# Patient Record
Sex: Male | Born: 1980
Health system: Southern US, Community
[De-identification: ages and names within clinical notes are randomized; demographics above are authoritative.]

## PROBLEM LIST (undated history)

## (undated) DIAGNOSIS — T7840XA Allergy, unspecified, initial encounter: Secondary | ICD-10-CM

## (undated) DIAGNOSIS — F419 Anxiety disorder, unspecified: Secondary | ICD-10-CM

## (undated) DIAGNOSIS — F1911 Other psychoactive substance abuse, in remission: Secondary | ICD-10-CM

## (undated) DIAGNOSIS — B019 Varicella without complication: Secondary | ICD-10-CM

## (undated) DIAGNOSIS — J40 Bronchitis, not specified as acute or chronic: Secondary | ICD-10-CM

## (undated) DIAGNOSIS — U071 COVID-19: Secondary | ICD-10-CM

## (undated) HISTORY — DX: COVID-19: U07.1

## (undated) HISTORY — PX: VASECTOMY: SHX75

## (undated) HISTORY — DX: Anxiety disorder, unspecified: F41.9

## (undated) HISTORY — DX: Allergy, unspecified, initial encounter: T78.40XA

## (undated) HISTORY — DX: Other psychoactive substance abuse, in remission: F19.11

## (undated) HISTORY — DX: Bronchitis, not specified as acute or chronic: J40

## (undated) HISTORY — DX: Varicella without complication: B01.9

---
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
## 2013-09-08 LAB — HIV ANTIBODY (ROUTINE TESTING W REFLEX): HIV 1&2 Ab, 4th Generation: NEGATIVE

## 2015-09-16 DIAGNOSIS — F1911 Other psychoactive substance abuse, in remission: Secondary | ICD-10-CM

## 2015-09-16 HISTORY — DX: Other psychoactive substance abuse, in remission: F19.11

## 2017-12-23 DIAGNOSIS — Z0000 Encounter for general adult medical examination without abnormal findings: Secondary | ICD-10-CM | POA: Diagnosis not present

## 2018-02-10 ENCOUNTER — Encounter: Payer: Self-pay | Admitting: Urology

## 2018-02-10 ENCOUNTER — Ambulatory Visit (INDEPENDENT_AMBULATORY_CARE_PROVIDER_SITE_OTHER): Payer: 59 | Admitting: Urology

## 2018-02-10 VITALS — BP 134/79 | HR 76 | Ht 70.0 in | Wt 155.0 lb

## 2018-02-10 DIAGNOSIS — Z3009 Encounter for other general counseling and advice on contraception: Secondary | ICD-10-CM

## 2018-02-11 ENCOUNTER — Encounter: Payer: Self-pay | Admitting: Urology

## 2018-02-11 MED ORDER — DIAZEPAM 10 MG PO TABS: ORAL_TABLET | ORAL | 0 refills | Status: DC

## 2018-02-11 NOTE — Progress Notes (Signed)
02/10/2018 7:44 AM   Ethel Rana April 27, 1981 [id number]  Referring provider: Marina Goodell, MD [address] [place], [place] [address]  Chief Complaint  Patient presents with  . VAS Consult    New Patient    HPI: 37 year old male married with children presents with his wife for vasectomy counseling.  They have discussed permanent sterilization and desire vasectomy.  He denies previous history of urologic problems and specifically denies chronic scrotal pain or history of epididymitis.   PMH: Past Medical History:  Diagnosis Date  . History of substance abuse 09/16/2015    Surgical History: Past Surgical History:  Procedure Laterality Date  . CLUB FOOT RELEASE  1980    Home Medications:  Allergies as of 02/10/2018      Reactions   Latex Rash      Medication List    as of 02/10/2018 11:59 PM   You have not been prescribed any medications.     Allergies:  Allergies  Allergen Reactions  . Latex Rash    Family History: History reviewed. No pertinent family history.  Social History:  reports that he has never smoked. He has never used smokeless tobacco. He reports that he drinks alcohol. He reports that he does not use drugs.  ROS: UROLOGY Frequent Urination?: No Hard to postpone urination?: No Burning/pain with urination?: No Get up at night to urinate?: No Leakage of urine?: No Urine stream starts and stops?: No Trouble starting stream?: No Do you have to strain to urinate?: No Blood in urine?: No Urinary tract infection?: No Sexually transmitted disease?: No Injury to kidneys or bladder?: No Painful intercourse?: No Weak stream?: No Erection problems?: No Penile pain?: No  Gastrointestinal Nausea?: No Vomiting?: No Indigestion/heartburn?: No Diarrhea?: No Constipation?: No  Constitutional Fever: No Night sweats?: No Weight loss?: No Fatigue?: No  Skin Skin rash/lesions?: No Itching?: No  Eyes Blurred vision?:  No Double vision?: No  Ears/Nose/Throat Sore throat?: No Sinus problems?: No  Hematologic/Lymphatic Swollen glands?: No Easy bruising?: No  Cardiovascular Leg swelling?: No Chest pain?: No  Respiratory Cough?: No Shortness of breath?: No  Endocrine Excessive thirst?: No  Musculoskeletal Back pain?: No Joint pain?: No  Neurological Headaches?: No Dizziness?: No  Psychologic Depression?: No Anxiety?: No  Physical Exam: BP 134/79   Pulse 76   Ht 5\' 10"  (1.778 m)   Wt 155 lb (70.3 kg)   BMI 22.24 kg/m   Constitutional:  Alert and oriented, No acute distress. HEENT: [place] AT, moist mucus membranes.  Trachea midline, no masses. Cardiovascular: No clubbing, cyanosis, or edema. Respiratory: Normal respiratory effort, no increased work of breathing. GI: Abdomen is soft, nontender, nondistended, no abdominal masses GU: No CVA tenderness.  Penis without lesions, testes descended bilaterally without masses or tenderness.  No paratesticular abnormalities.  Vasa easily palpable bilaterally. Lymph: No cervical or inguinal lymphadenopathy. Skin: No rashes, bruises or suspicious lesions. Neurologic: Grossly intact, no focal deficits, moving all 4 extremities. Psychiatric: Normal mood and affect.    Assessment & Plan:   37 year old male with undesired fertility who desires to proceed with vasectomy.  We had a long discussion about vasectomy. We specifically discussed the procedure, recovery and the risks, benefits and alternatives of vasectomy. I explained that the procedure entails removal of a segment of each vas deferens, each of which conducts sperm, and that the purpose of this procedure is to cause sterility (inability to produce children or cause pregnancy). Vasectomy is intended to be permanent and irreversible form  of contraception. Options for fertility after vasectomy include vasectomy reversal, or sperm retrieval with in vitro fertilization. These options are not always  successful, and they may be expensive. We discussed reversible forms of birth control such as condoms, IUD or diaphragms, as well as the option of freezing sperm in a sperm bank prior to the vasectomy procedure. We discussed the importance of avoiding strenuous exercise for four days after vasectomy, and the importance of refraining from any form of ejaculation for seven days after vasectomy. I explained that vasectomy does not produce immediate sterility so another form of contraceptive must be used until sterility is assured by having semen checked for sperm. Thus, a post vasectomy semen analysis is necessary to confirm sterility. Rarely, vasectomy must be repeated. We discussed the approximately 1 in 2,000 risk of pregnancy after vasectomy for men who have post-vasectomy semen analysis showing absent sperm or rare non-motile sperm. Typical side effects include a small amount of oozing blood, some discomfort and mild swelling in the area of incision.  Vasectomy does not affect sexual performance, function, please, sensation, interest, desire, satisfaction, penile erection, volume of semen or ejaculation. Other rare risks include allergy or adverse reaction to an anesthetic, testicular atrophy, hematoma, infection/abscess, prolonged tenderness of the vas deferens, pain, swelling, painful nodule or scar (called sperm granuloma) or epididymtis. We discussed chronic testicular pain syndrome. This has been reported to occur in as many as 1-2% of men and may be permanent. This can be treated with medication, small procedures or (rarely) surgery.   Riki AltesScott C Lowana Hable, MD  [facility] [address], [address] [place], [place] [address] [phone]

## 2018-03-23 ENCOUNTER — Ambulatory Visit (INDEPENDENT_AMBULATORY_CARE_PROVIDER_SITE_OTHER): Payer: 59 | Admitting: Urology

## 2018-03-23 ENCOUNTER — Encounter: Payer: Self-pay | Admitting: Urology

## 2018-03-23 VITALS — BP 107/57 | HR 69 | Ht 70.0 in | Wt 160.8 lb

## 2018-03-23 DIAGNOSIS — Z302 Encounter for sterilization: Secondary | ICD-10-CM

## 2018-03-23 MED ORDER — HYDROCODONE-ACETAMINOPHEN 5-325 MG PO TABS: 1.0000 | ORAL_TABLET | ORAL | 0 refills | Status: DC | PRN

## 2018-03-23 NOTE — Progress Notes (Signed)
Vasectomy Procedure Note  Indications: The patient is a 37 y.o. male who presents today for elective sterilization.  He has been consented for the procedure.  He is aware of the risks and benefits.  He had no additional questions.  He agrees to proceed.  He denies any other significant change since his last visit.  Pre-operative Diagnosis: Elective sterilization  Post-operative Diagnosis: Elective sterilization  Premedication: Valium 10 mg po  Surgeon: Lorin PicketScott C. Stoioff, M.D  Description: The patient was prepped and draped in the standard fashion.  The right vas deferens was identified and brought superiorly to the anterior scrotal skin.  The skin and vas was then anesthetized utilizing 6 ml 1% lidocaine.  A small stab incision was made and spread with the vas dissector.  The vas was grasped utilizing the vas clamp and elevated out of the incision.  The vas was dissected free from surrounding tissue and vessels and an ~1 cm segment was excised.  The vas lumens were cauterized utilizing electrocautery.  The distal segment was buried in the surrounding sheath with a 3-0 chromic suture.  No significant bleeding was observed.  The vas ends were then dropped back into the hemiscrotum.  The skin was closed with hemostatic pressure.  An identical procedure was performed on the contralateral side.  Clean dry gauze was applied to the incision sites.  The patient tolerated the procedure well.  Complications:None  Recommendations: 1.  No lifting greater than 10 pounds or strenuousactivity for 1 week. 2.  Scrotal support for 1 week. 3.  Shower only for 1 week; may shower in the morning 4.  May resume intercourse in one week if no significant discomfort.  Continue alternate contraception for 12 weeks.  5.  Call for significant pain, swelling, redness, drainage or fever greater than 100.5. 6.  Rx hydrocodone/APAP 5/325 1-2 every 6 hours as needed for pain. 7.  Follow-up semen analysis 12 weeks.

## 2018-05-26 DIAGNOSIS — H52221 Regular astigmatism, right eye: Secondary | ICD-10-CM | POA: Diagnosis not present

## 2018-05-26 DIAGNOSIS — H5213 Myopia, bilateral: Secondary | ICD-10-CM | POA: Diagnosis not present

## 2018-06-14 ENCOUNTER — Other Ambulatory Visit: Payer: Self-pay

## 2018-06-14 DIAGNOSIS — Z9852 Vasectomy status: Secondary | ICD-10-CM

## 2018-06-15 ENCOUNTER — Encounter: Payer: Self-pay | Admitting: Urology

## 2018-06-15 ENCOUNTER — Other Ambulatory Visit: Payer: 59

## 2018-06-17 ENCOUNTER — Other Ambulatory Visit: Payer: 59

## 2018-06-17 ENCOUNTER — Encounter: Payer: Self-pay | Admitting: Urology

## 2018-06-28 ENCOUNTER — Other Ambulatory Visit: Payer: 59

## 2018-06-28 DIAGNOSIS — Z9852 Vasectomy status: Secondary | ICD-10-CM | POA: Diagnosis not present

## 2018-06-29 LAB — POST-VAS SPERM EVALUATION,QUAL: SEMEN VOLUME: 0.9 mL

## 2018-06-30 ENCOUNTER — Telehealth: Payer: Self-pay | Admitting: Family Medicine

## 2018-06-30 NOTE — Telephone Encounter (Signed)
-----   Message from Riki AltesScott C Stoioff, MD sent at 06/30/2018  7:34 AM EST ----- Semen analysis showed no sperm present.  Okay to use vasectomy as primary contraception.

## 2018-06-30 NOTE — Telephone Encounter (Signed)
Unable to leave message, no VM set up.

## 2018-07-01 NOTE — Telephone Encounter (Signed)
Attempted to reach pt, no vm is set up, Second Attempt

## 2018-07-05 NOTE — Telephone Encounter (Signed)
Called pt no answer. 3rd attempt will mail letter. Also sent link for mychart sign up.

## 2018-07-28 DIAGNOSIS — J40 Bronchitis, not specified as acute or chronic: Secondary | ICD-10-CM | POA: Diagnosis not present

## 2018-12-26 DIAGNOSIS — Z0000 Encounter for general adult medical examination without abnormal findings: Secondary | ICD-10-CM | POA: Diagnosis not present

## 2019-08-09 DIAGNOSIS — M542 Cervicalgia: Secondary | ICD-10-CM | POA: Diagnosis not present

## 2019-08-09 DIAGNOSIS — M25611 Stiffness of right shoulder, not elsewhere classified: Secondary | ICD-10-CM | POA: Diagnosis not present

## 2019-08-09 DIAGNOSIS — M9901 Segmental and somatic dysfunction of cervical region: Secondary | ICD-10-CM | POA: Diagnosis not present

## 2019-08-09 DIAGNOSIS — M9907 Segmental and somatic dysfunction of upper extremity: Secondary | ICD-10-CM | POA: Diagnosis not present

## 2019-12-27 DIAGNOSIS — S46911A Strain of unspecified muscle, fascia and tendon at shoulder and upper arm level, right arm, initial encounter: Secondary | ICD-10-CM | POA: Diagnosis not present

## 2019-12-27 DIAGNOSIS — Z125 Encounter for screening for malignant neoplasm of prostate: Secondary | ICD-10-CM | POA: Diagnosis not present

## 2019-12-27 DIAGNOSIS — Z0000 Encounter for general adult medical examination without abnormal findings: Secondary | ICD-10-CM | POA: Diagnosis not present

## 2019-12-27 DIAGNOSIS — Z13220 Encounter for screening for lipoid disorders: Secondary | ICD-10-CM | POA: Diagnosis not present

## 2020-01-23 DIAGNOSIS — H52221 Regular astigmatism, right eye: Secondary | ICD-10-CM | POA: Diagnosis not present

## 2020-01-23 DIAGNOSIS — H5213 Myopia, bilateral: Secondary | ICD-10-CM | POA: Diagnosis not present

## 2020-12-26 ENCOUNTER — Encounter: Payer: Self-pay | Admitting: Emergency Medicine

## 2020-12-26 ENCOUNTER — Other Ambulatory Visit: Payer: Self-pay

## 2020-12-26 ENCOUNTER — Ambulatory Visit
Admission: EM | Admit: 2020-12-26 | Discharge: 2020-12-26 | Disposition: A | Discharge status: Home / Self Care | Payer: BC Managed Care – PPO | Attending: Physician Assistant | Admitting: Physician Assistant

## 2020-12-26 DIAGNOSIS — Z7952 Long term (current) use of systemic steroids: Secondary | ICD-10-CM | POA: Diagnosis not present

## 2020-12-26 DIAGNOSIS — J029 Acute pharyngitis, unspecified: Secondary | ICD-10-CM | POA: Diagnosis not present

## 2020-12-26 DIAGNOSIS — Z20822 Contact with and (suspected) exposure to covid-19: Secondary | ICD-10-CM | POA: Insufficient documentation

## 2020-12-26 DIAGNOSIS — R059 Cough, unspecified: Secondary | ICD-10-CM | POA: Diagnosis not present

## 2020-12-26 DIAGNOSIS — J209 Acute bronchitis, unspecified: Secondary | ICD-10-CM | POA: Diagnosis present

## 2020-12-26 LAB — RESP PANEL BY RT-PCR (FLU A&B, COVID) ARPGX2
Influenza A by PCR: NEGATIVE
Influenza B by PCR: NEGATIVE
SARS Coronavirus 2 by RT PCR: NEGATIVE

## 2020-12-26 LAB — GROUP A STREP BY PCR: Group A Strep by PCR: NOT DETECTED

## 2020-12-26 MED ORDER — PROMETHAZINE-DM 6.25-15 MG/5ML PO SYRP: 5.0000 mL | ORAL_SOLUTION | Freq: Four times a day (QID) | ORAL | 0 refills | Status: DC | PRN

## 2020-12-26 MED ORDER — PREDNISONE 20 MG PO TABS: 40.0000 mg | ORAL_TABLET | Freq: Every day | ORAL | 0 refills | Status: AC

## 2020-12-26 MED ORDER — AZITHROMYCIN 250 MG PO TABS: 250.0000 mg | ORAL_TABLET | Freq: Every day | ORAL | 0 refills | Status: DC

## 2020-12-26 NOTE — Discharge Instructions (Signed)

## 2020-12-26 NOTE — ED Provider Notes (Signed)
[facility]    CSN: [id number] Arrival date & time: 12/26/20  1352      History   Chief Complaint Chief Complaint  Patient presents with  . Cough  . Nasal Congestion  . Sore Throat    HPI Tim Ramirez is a 40 y.o. male presenting for 3 to 4-week history of productive cough, nasal congestion, and sore throat.  Patient says that the sore throat related to start over the past couple of days getting worse.  He denies any fevers or fatigue.  No sinus pain or chest tightness but says he occasionally is short of breath when he is working.  States he has to work around a lot of chemicals and that seems to make things worse.  He denies ever seeking care for his symptoms.  States that his partner had a sinus infection at that time he started to have his symptoms.  Has taken at home COVID test recently and it was negative.  No known exposure to COVID-19.  No known exposure to influenza.  Has been taking over-the-counter cough medications and decongestants.  Patient says the cough is not getting better and actually seems to be worse following a period of time where it seemed to be improving last week.  He has no history of cardiopulmonary disease.  No other concerns.  HPI  Past Medical History:  Diagnosis Date  . History of substance abuse (HCC) 09/16/2015    Patient Active Problem List   Diagnosis Date Noted  . History of substance abuse (HCC) 09/16/2015    Past Surgical History:  Procedure Laterality Date  . CLUB FOOT RELEASE  1980       Home Medications    Prior to Admission medications   Medication Sig Start Date End Date Taking? Authorizing Provider  azithromycin (ZITHROMAX) 250 MG tablet Take 1 tablet (250 mg total) by mouth daily. Take first 2 tablets together, then 1 every day until finished. 12/26/20  Yes Shirlee Latch, PA-C  predniSONE (DELTASONE) 20 MG tablet Take 2 tablets (40 mg total) by mouth daily for 5 days. 12/26/20 12/31/20 Yes Shirlee Latch, PA-C   promethazine-dextromethorphan (PROMETHAZINE-DM) 6.25-15 MG/5ML syrup Take 5 mLs by mouth 4 (four) times daily as needed for cough. 12/26/20  Yes Eusebio Friendly B, PA-C  diazepam (VALIUM) 10 MG tablet 1 tab po 30 min prior to procedure 02/11/18   Riki Altes, MD    Family History History reviewed. No pertinent family history.  Social History Social History   Tobacco Use  . Smoking status: Never Smoker  . Smokeless tobacco: Never Used  Vaping Use  . Vaping Use: Never used  Substance Use Topics  . Alcohol use: Not Currently  . Drug use: Not Currently     Allergies   Latex   Review of Systems Review of Systems  Constitutional: Negative for fatigue and fever.  HENT: Positive for congestion, rhinorrhea and sore throat. Negative for sinus pressure and sinus pain.   Respiratory: Positive for cough and shortness of breath. Negative for chest tightness.   Cardiovascular: Negative for chest pain.  Gastrointestinal: Negative for abdominal pain, diarrhea, nausea and vomiting.  Musculoskeletal: Negative for myalgias.  Neurological: Negative for weakness, light-headedness and headaches.  Hematological: Negative for adenopathy.     Physical Exam Triage Vital Signs ED Triage Vitals  Enc Vitals Group     BP 12/26/20 1434 124/83     Pulse Rate 12/26/20 1434 79     Resp 12/26/20  1434 18     Temp 12/26/20 1434 98.4 F (36.9 C)     Temp Source 12/26/20 1434 Oral     SpO2 12/26/20 1434 99 %     Weight 12/26/20 1432 160 lb 11.5 oz (72.9 kg)     Height 12/26/20 1432 5\' 10"  (1.778 m)     Head Circumference --      Peak Flow --      Pain Score 12/26/20 1432 5     Pain Loc --      Pain Edu? --      Excl. in GC? --    No data found.  Updated Vital Signs BP 124/83 (BP Location: Right Arm)   Pulse 79   Temp 98.4 F (36.9 C) (Oral)   Resp 18   Ht 5\' 10"  (1.778 m)   Wt 160 lb 11.5 oz (72.9 kg)   SpO2 99%   BMI 23.06 kg/m       Physical Exam Vitals and nursing note  reviewed.  Constitutional:      General: He is not in acute distress.    Appearance: Normal appearance. He is well-developed. He is not ill-appearing or diaphoretic.  HENT:     Head: Normocephalic and atraumatic.     Right Ear: Tympanic membrane, ear canal and external ear normal.     Left Ear: Tympanic membrane, ear canal and external ear normal.     Nose: Congestion and rhinorrhea (trace light yellow drainage) present.     Mouth/Throat:     Mouth: Mucous membranes are moist.     Pharynx: Oropharynx is clear. Uvula midline. Posterior oropharyngeal erythema (mild) present.     Tonsils: No tonsillar abscesses.  Eyes:     General: No scleral icterus.       Right eye: No discharge.        Left eye: No discharge.     Conjunctiva/sclera: Conjunctivae normal.  Neck:     Thyroid: No thyromegaly.     Trachea: No tracheal deviation.  Cardiovascular:     Rate and Rhythm: Normal rate and regular rhythm.     Heart sounds: Normal heart sounds.  Pulmonary:     Effort: Pulmonary effort is normal. No respiratory distress.     Breath sounds: Normal breath sounds. No wheezing, rhonchi or rales.  Musculoskeletal:     Cervical back: Normal range of motion and neck supple.  Lymphadenopathy:     Cervical: No cervical adenopathy.  Skin:    General: Skin is warm and dry.     Findings: No rash.  Neurological:     General: No focal deficit present.     Mental Status: He is alert. Mental status is at baseline.     Motor: No weakness.     Gait: Gait normal.  Psychiatric:        Mood and Affect: Mood normal.        Behavior: Behavior normal.        Thought Content: Thought content normal.      UC Treatments / Results  Labs (all labs ordered are listed, but only abnormal results are displayed) Labs Reviewed  GROUP A STREP BY PCR  RESP PANEL BY RT-PCR (FLU A&B, COVID) [id number]    EKG   Radiology No results found.  Procedures Procedures (including critical care time)  Medications  Ordered in UC Medications - No data to display  Initial Impression / Assessment and Plan / UC Course  I have reviewed the triage  vital signs and the nursing notes.  Pertinent labs & imaging results that were available during my care of the patient were reviewed by me and considered in my medical decision making (see chart for details).   40 year old male presenting for approximately 3 to 4-week history of cough and congestion.  Also complaining of sore throat.  Patient says that his symptoms seem to be getting better and then they worsened since last week.  Vital signs are all normal and stable.  He is afebrile and oxygen is 99%.  Respiratory panel was ordered by nursing staff.  Negative for influenza and COVID-19.  Strep test obtained and negative.  Given the fact that he is not having any sinus pain or pressure, suspect that he may be having bronchitis at this point.  Advised him that 90% of cases are generally viral, but since his symptoms have been persistent so long, we can try a trial of an antibiotic.  Sent in azithromycin as well as prednisone and Promethazine DM.  Encouraged him to increase rest and fluids.  Advised to follow-up as needed if he is still not feeling better over the next week and we can consider chest x-ray.  Holding off on that at this point since his vitals are all stable and his lungs are clear.  Work note given.  Final Clinical Impressions(s) / UC Diagnoses   Final diagnoses:  Acute bronchitis, unspecified organism  Cough  Sore throat     Discharge Instructions     You have received COVID testing today either for positive exposure, concerning symptoms that could be related to COVID infection, screening purposes, or re-testing after confirmed positive.  Your test obtained today checks for active viral infection in the last 1-2 weeks. If your test is negative now, you can still test positive later. So, if you do develop symptoms you should either get re-tested  and/or isolate x 5 days and then strict mask use x 5 days (unvaccinated) or mask use x 10 days (vaccinated). Please follow CDC guidelines.  While Rapid antigen tests come back in 15-20 minutes, send out PCR/molecular test results typically come back within 1-3 days. In the mean time, if you are symptomatic, assume this could be a positive test and treat/monitor yourself as if you do have COVID.   We will call with test results if positive. Please download the MyChart app and set up a profile to access test results.   If symptomatic, go home and rest. Push fluids. Take Tylenol as needed for discomfort. Gargle warm salt water. Throat lozenges. Take Mucinex DM or Robitussin for cough. Humidifier in bedroom to ease coughing. Warm showers. Also review the COVID handout for more information.  COVID-19 INFECTION: The incubation period of COVID-19 is approximately 14 days after exposure, with most symptoms developing in roughly 4-5 days. Symptoms may range in severity from mild to critically severe. Roughly 80% of those infected will have mild symptoms. People of any age may become infected with COVID-19 and have the ability to transmit the virus. The most common symptoms include: fever, fatigue, cough, body aches, headaches, sore throat, nasal congestion, shortness of breath, nausea, vomiting, diarrhea, changes in smell and/or taste.    COURSE OF ILLNESS Some patients may begin with mild disease which can progress quickly into critical symptoms. If your symptoms are worsening please call ahead to the Emergency Department and proceed there for further treatment. Recovery time appears to be roughly 1-2 weeks for mild symptoms and 3-6 weeks for severe  disease.   GO IMMEDIATELY TO ER FOR FEVER YOU ARE UNABLE TO GET DOWN WITH TYLENOL, BREATHING PROBLEMS, CHEST PAIN, FATIGUE, LETHARGY, INABILITY TO EAT OR DRINK, ETC  QUARANTINE AND ISOLATION: To help decrease the spread of COVID-19 please remain isolated if you  have COVID infection or are highly suspected to have COVID infection. This means -stay home and isolate to one room in the home if you live with others. Do not share a bed or bathroom with others while ill, sanitize and wipe down all countertops and keep common areas clean and disinfected. Stay home for 5 days. If you have no symptoms or your symptoms are resolving after 5 days, you can leave your house. Continue to wear a mask around others for 5 additional days. If you have been in close contact (within 6 feet) of someone diagnosed with COVID 19, you are advised to quarantine in your home for 14 days as symptoms can develop anywhere from 2-14 days after exposure to the virus. If you develop symptoms, you  must isolate.  Most current guidelines for COVID after exposure -unvaccinated: isolate 5 days and strict mask use x 5 days. Test on day 5 is possible -vaccinated: wear mask x 10 days if symptoms do not develop -You do not necessarily need to be tested for COVID if you have + exposure and  develop symptoms. Just isolate at home x10 days from symptom onset During this global pandemic, CDC advises to practice social distancing, try to stay at least 866ft away from others at all times. Wear a face covering. Wash and sanitize your hands regularly and avoid going anywhere that is not necessary.  KEEP IN MIND THAT THE COVID TEST IS NOT 100% ACCURATE AND YOU SHOULD STILL DO EVERYTHING TO PREVENT POTENTIAL SPREAD OF VIRUS TO OTHERS (WEAR MASK, WEAR GLOVES, WASH HANDS AND SANITIZE REGULARLY). IF INITIAL TEST IS NEGATIVE, THIS MAY NOT MEAN YOU ARE DEFINITELY NEGATIVE. MOST ACCURATE TESTING IS DONE 5-7 DAYS AFTER EXPOSURE.   It is not advised by CDC to get re-tested after receiving a positive COVID test since you can still test positive for weeks to months after you have already cleared the virus.   *If you have not been vaccinated for COVID, I strongly suggest you consider getting vaccinated as long as there are  no contraindications.      ED Prescriptions    Medication Sig Dispense Auth. Provider   promethazine-dextromethorphan (PROMETHAZINE-DM) 6.25-15 MG/5ML syrup Take 5 mLs by mouth 4 (four) times daily as needed for cough. 118 mL Eusebio FriendlyEaves, Devaunte Gasparini B, PA-C   azithromycin (ZITHROMAX) 250 MG tablet Take 1 tablet (250 mg total) by mouth daily. Take first 2 tablets together, then 1 every day until finished. 6 tablet Eusebio FriendlyEaves, Segundo Makela B, PA-C   predniSONE (DELTASONE) 20 MG tablet Take 2 tablets (40 mg total) by mouth daily for 5 days. 10 tablet Gareth MorganEaves, Annarae Macnair B, PA-C     PDMP not reviewed this encounter.   Shirlee Latchaves, Ross Bender B, PA-C 12/26/20 [phone]

## 2020-12-26 NOTE — ED Triage Notes (Addendum)
Patient c/o cough and nasal congestion that started 3 weeks ago. He states his symptoms improved some but now he is getting worse. Denies fever. At home COVID test was negative.

## 2021-04-19 ENCOUNTER — Ambulatory Visit
Admission: RE | Admit: 2021-04-19 | Discharge: 2021-04-19 | Disposition: A | Discharge status: Home / Self Care | Payer: BC Managed Care – PPO | Source: Ambulatory Visit | Attending: Emergency Medicine | Admitting: Emergency Medicine

## 2021-04-19 ENCOUNTER — Ambulatory Visit (INDEPENDENT_AMBULATORY_CARE_PROVIDER_SITE_OTHER): Payer: BC Managed Care – PPO

## 2021-04-19 ENCOUNTER — Other Ambulatory Visit: Payer: Self-pay

## 2021-04-19 VITALS — BP 127/87 | HR 66 | Temp 98.9°F | Resp 18 | Ht 70.0 in | Wt 168.0 lb

## 2021-04-19 DIAGNOSIS — S7002XA Contusion of left hip, initial encounter: Secondary | ICD-10-CM | POA: Insufficient documentation

## 2021-04-19 DIAGNOSIS — S20212A Contusion of left front wall of thorax, initial encounter: Secondary | ICD-10-CM | POA: Insufficient documentation

## 2021-04-19 DIAGNOSIS — W19XXXA Unspecified fall, initial encounter: Secondary | ICD-10-CM | POA: Diagnosis not present

## 2021-04-19 DIAGNOSIS — R0782 Intercostal pain: Secondary | ICD-10-CM

## 2021-04-19 DIAGNOSIS — R102 Pelvic and perineal pain: Secondary | ICD-10-CM

## 2021-04-19 LAB — POCT URINALYSIS DIP (DEVICE)
Bilirubin Urine: NEGATIVE
Glucose, UA: NEGATIVE mg/dL
Hgb urine dipstick: NEGATIVE
Ketones, ur: NEGATIVE mg/dL
Nitrite: NEGATIVE
Protein, ur: NEGATIVE mg/dL
Specific Gravity, Urine: 1.01 (ref 1.005–1.030)
Urobilinogen, UA: 0.2 mg/dL (ref 0.0–1.0)
pH: 6 (ref 5.0–8.0)

## 2021-04-19 MED ORDER — TRAMADOL HCL 50 MG PO TABS: 50.0000 mg | ORAL_TABLET | Freq: Four times a day (QID) | ORAL | 0 refills | Status: DC | PRN

## 2021-04-19 NOTE — Discharge Instructions (Signed)
Your trays today were negative for broken bones.  Apply ice to your left chest wall and hip for 20 minutes at a time 2-3 times a day to help minimize pain and swelling.  Take over-the-counter Tylenol and ibuprofen according to the package instructions as needed for mild to moderate pain.  Use the tramadol as needed for severe pain.  I will write you a note to be out of work for a couple of days to give your body a chance to heal.  If you develop any new or worsening symptoms return for reevaluation or go to the emergency department.

## 2021-04-19 NOTE — ED Provider Notes (Signed)
[facility]    CSN: [id number] Arrival date & time: 04/19/21  1243      History   Chief Complaint Chief Complaint  Patient presents with   Fall   Rib Injury    left    HPI Tim Ramirez is a 40 y.o. male.   HPI  40 year old male here for evaluation of left rib pain.  Patient reports that he works for [employer] and when he got home last night he was getting out of the shower when he slipped and fell.  He states that he hit the left side of his pelvis on the edge of the tub first followed by his left flank and rib cage.  He states he did not hit his head and did not have a loss of consciousness.  He states that he got swelling over the left side of his ribs under his arm and the pain increases with movement, laying down, or deep breathing.  He denies any cough, shortness of breath, or hemoptysis.  He also denies any hematuria.  Past Medical History:  Diagnosis Date   History of substance abuse (HCC) 09/16/2015    Patient Active Problem List   Diagnosis Date Noted   History of substance abuse (HCC) 09/16/2015    Past Surgical History:  Procedure Laterality Date   CLUB FOOT RELEASE  1980       Home Medications    Prior to Admission medications   Medication Sig Start Date End Date Taking? Authorizing Provider  traMADol (ULTRAM) 50 MG tablet Take 1 tablet (50 mg total) by mouth every 6 (six) hours as needed. 04/19/21  Yes Becky Augusta, NP  diazepam (VALIUM) 10 MG tablet 1 tab po 30 min prior to procedure 02/11/18   Riki Altes, MD    Family History History reviewed. No pertinent family history.  Social History Social History   Tobacco Use   Smoking status: Never   Smokeless tobacco: Never  Vaping Use   Vaping Use: Never used  Substance Use Topics   Alcohol use: Not Currently   Drug use: Not Currently     Allergies   Latex and Nickel   Review of Systems Review of Systems  Constitutional:  Negative for activity change, appetite change  and fever.  Respiratory:  Negative for cough and shortness of breath.   Cardiovascular:  Positive for chest pain.  Skin:  Positive for color change and wound.  Neurological:  Negative for syncope.  Hematological: Negative.     Physical Exam Triage Vital Signs ED Triage Vitals  Enc Vitals Group     BP --      Pulse --      Resp --      Temp --      Temp src --      SpO2 --      Weight 04/19/21 1257 168 lb (76.2 kg)     Height 04/19/21 1257 5\' 10"  (1.778 m)     Head Circumference --      Peak Flow --      Pain Score 04/19/21 1256 7     Pain Loc --      Pain Edu? --      Excl. in GC? --    No data found.  Updated Vital Signs BP 127/87 (BP Location: Left Arm)   Pulse 66   Temp 98.9 F (37.2 C) (Oral)   Resp 18   Ht 5\' 10"  (1.778 m)   Wt  168 lb (76.2 kg)   SpO2 96%   BMI 24.11 kg/m   Visual Acuity Right Eye Distance:   Left Eye Distance:   Bilateral Distance:    Right Eye Near:   Left Eye Near:    Bilateral Near:     Physical Exam Vitals and nursing note reviewed.  Constitutional:      General: He is not in acute distress.    Appearance: Normal appearance. He is not ill-appearing.  HENT:     Head: Normocephalic and atraumatic.  Cardiovascular:     Rate and Rhythm: Normal rate and regular rhythm.     Pulses: Normal pulses.     Heart sounds: Normal heart sounds. No murmur heard.   No gallop.  Pulmonary:     Effort: Pulmonary effort is normal.     Breath sounds: Normal breath sounds. No wheezing, rhonchi or rales.  Musculoskeletal:        General: Swelling and tenderness present.  Skin:    General: Skin is warm and dry.     Capillary Refill: Capillary refill takes less than 2 seconds.     Findings: Bruising present. No erythema.  Neurological:     General: No focal deficit present.     Mental Status: He is alert and oriented to person, place, and time.  Psychiatric:        Mood and Affect: Mood normal.        Behavior: Behavior normal.         Thought Content: Thought content normal.        Judgment: Judgment normal.     UC Treatments / Results  Labs (all labs ordered are listed, but only abnormal results are displayed) Labs Reviewed  POCT URINALYSIS DIPSTICK, ED / UC    EKG   Radiology DG Ribs Unilateral W/Chest Left  Result Date: 04/19/2021 CLINICAL DATA:  40 year old male status post fall. EXAM: LEFT RIBS AND CHEST - 3+ VIEW COMPARISON:  None. FINDINGS: No fracture or other bone lesions are seen involving the ribs. There is no evidence of pneumothorax or pleural effusion. Both lungs are clear. Heart size and mediastinal contours are within normal limits. IMPRESSION: No evidence of acute rib fracture or cardiopulmonary process. Electronically Signed   By: Marliss Coots M.D.   On: 04/19/2021 13:46   DG Pelvis 1-2 Views  Result Date: 04/19/2021 CLINICAL DATA:  pain over left iliac crest s/p fall EXAM: PELVIS - 1 VIEW COMPARISON:  None. FINDINGS: There is no evidence of pelvic fracture or diastasis. No pelvic bone lesions are seen. Pelvic phleboliths. IMPRESSION: Negative. If persistent concern, recommend additional radiographic views. Electronically Signed   By: Meda Klinefelter M.D.   On: 04/19/2021 13:32    Procedures Procedures (including critical care time)  Medications Ordered in UC Medications - No data to display  Initial Impression / Assessment and Plan / UC Course  I have reviewed the triage vital signs and the nursing notes.  Pertinent labs & imaging results that were available during my care of the patient were reviewed by me and considered in my medical decision making (see chart for details).  Patient is a nontoxic-appearing 40 year old male here for evaluation of left-sided rib and pelvis pain after slipping in the shower last night and impacting the side of the tub.  He states his pelvis at first and then his rib cage.  He states that the wind was knocked out of him but he did not hit his head or have  loss of consciousness.  He has not had any cough, shortness of breath or hemoptysis.  He also denies any hematuria.  He is here today because he has increased pain with movement, laying flat, or deep breathing.  He does endorse swelling over the lower aspect of his left rib cage midaxillary line.  He also had abrasion on his left iliac crest and pain in his pelvis.  Patient is ambulating without difficulty and has no pain in his hip joint.  Patient's physical exam reveals a benign cardiopulmonary exam with clear lung sounds in all fields.  Patient has normal chest excursion with respiration.  There is swelling present over the sixth through 10th rib in the midaxillary line.  There is mild tenderness in the ninth and 10th rib mid axillary line but no crepitus appreciated.  Patient does have some tenderness with palpation of the flank and also with palpation of the left iliac crest.  Will obtain left rib films, pelvis films, and a urinalysis to check for the presence of blood.  Left ribs and AP chest x-rays independently reviewed and evaluated by me.  Impression: The lung spaces are well-pneumatized.  There is no evidence of rib fracture visible on exam.  No pneumothorax.  Radiology overread is pending. Radiology impression is negative for rib fracture.  AP pelvis film independently reviewed and evaluated by me.  Impression: No evidence of fracture.  Pelvis is in normal alignment.  Radiology overread is pending. Radiology impression is negative for pelvic fracture.  Urinalysis collected and there is no evidence of gross blood.  Shows small leukocytes but is otherwise unremarkable.  Will discharge patient home with a diagnosis of left rib and hip contusions and treat conservatively with over-the-counter analgesia for mild to moderate pain and will give tramadol for severe pain.  Also encourage patient to apply ice to the area to minimize pain and inflammation.  Work note provided.   Final Clinical  Impressions(s) / UC Diagnoses   Final diagnoses:  Contusion of left chest wall, initial encounter  Contusion of left hip, initial encounter     Discharge Instructions      Your trays today were negative for broken bones.  Apply ice to your left chest wall and hip for 20 minutes at a time 2-3 times a day to help minimize pain and swelling.  Take over-the-counter Tylenol and ibuprofen according to the package instructions as needed for mild to moderate pain.  Use the tramadol as needed for severe pain.  I will write you a note to be out of work for a couple of days to give your body a chance to heal.  If you develop any new or worsening symptoms return for reevaluation or go to the emergency department.     ED Prescriptions     Medication Sig Dispense Auth. Provider   traMADol (ULTRAM) 50 MG tablet Take 1 tablet (50 mg total) by mouth every 6 (six) hours as needed. 15 tablet Becky Augusta, NP      I have reviewed the PDMP during this encounter.   Becky Augusta, NP 04/19/21 1401

## 2021-04-19 NOTE — ED Triage Notes (Signed)
Pt c/o fall in the shower last night. Pt reports pain to his left side/ribs, along with swelling and pain. Pt denies head injury or LOC, states it did "knock the breath out of him".

## 2021-09-16 ENCOUNTER — Ambulatory Visit: Payer: Self-pay

## 2021-09-16 ENCOUNTER — Telehealth: Payer: BC Managed Care – PPO | Admitting: Physician Assistant

## 2021-09-16 DIAGNOSIS — R051 Acute cough: Secondary | ICD-10-CM | POA: Diagnosis not present

## 2021-09-16 MED ORDER — BENZONATATE 100 MG PO CAPS: 100.0000 mg | ORAL_CAPSULE | Freq: Three times a day (TID) | ORAL | 0 refills | Status: DC | PRN

## 2021-09-16 MED ORDER — ALBUTEROL SULFATE HFA 108 (90 BASE) MCG/ACT IN AERS: 2.0000 | INHALATION_SPRAY | RESPIRATORY_TRACT | 0 refills | Status: DC | PRN

## 2021-09-16 MED ORDER — AEROCHAMBER PLUS FLO-VU MEDIUM MISC: 1.0000 | Freq: Once | 0 refills | Status: AC

## 2021-09-16 MED ORDER — DOXYCYCLINE HYCLATE 100 MG PO CAPS: 100.0000 mg | ORAL_CAPSULE | Freq: Two times a day (BID) | ORAL | 0 refills | Status: DC

## 2021-09-16 NOTE — Progress Notes (Signed)
We are sorry that you are not feeling well.  Here is how we plan to help!  Based on your presentation I believe you most likely have A cough due to bacteria.  When patients have a fever and a productive cough with a change in color or increased sputum production, we are concerned about bacterial bronchitis.  If left untreated it can progress to pneumonia.  If your symptoms do not improve with your treatment plan it is important that you contact your provider.   I have prescribed Doxycycline 100 mg twice a day for 7 days     In addition you may use A prescription cough medication called Tessalon Perles 100mg . You may take 1-2 capsules every 8 hours as needed for your cough.  Additionally I have prescribed Albuterol inhaler to help with coughing and wheezing as needed.  From your responses in the eVisit questionnaire you describe inflammation in the upper respiratory tract which is causing a significant cough.  This is commonly called Bronchitis and has four common causes:   Allergies Viral Infections Acid Reflux Bacterial Infection Allergies, viruses and acid reflux are treated by controlling symptoms or eliminating the cause. An example might be a cough caused by taking certain blood pressure medications. You stop the cough by changing the medication. Another example might be a cough caused by acid reflux. Controlling the reflux helps control the cough.  USE OF BRONCHODILATOR ("RESCUE") INHALERS: There is a risk from using your bronchodilator too frequently.  The risk is that over-reliance on a medication which only relaxes the muscles surrounding the breathing tubes can reduce the effectiveness of medications prescribed to reduce swelling and congestion of the tubes themselves.  Although you feel brief relief from the bronchodilator inhaler, your asthma may actually be worsening with the tubes becoming more swollen and filled with mucus.  This can delay other crucial treatments, such as oral steroid  medications. If you need to use a bronchodilator inhaler daily, several times per day, you should discuss this with your provider.  There are probably better treatments that could be used to keep your asthma under control.     HOME CARE Only take medications as instructed by your medical team. Complete the entire course of an antibiotic. Drink plenty of fluids and get plenty of rest. Avoid close contacts especially the very young and the elderly Cover your mouth if you cough or cough into your sleeve. Always remember to wash your hands A steam or ultrasonic humidifier can help congestion.   GET HELP RIGHT AWAY IF: You develop worsening fever. You become short of breath You cough up blood. Your symptoms persist after you have completed your treatment plan MAKE SURE YOU  Understand these instructions. Will watch your condition. Will get help right away if you are not doing well or get worse.    Thank you for choosing an e-visit.  Your e-visit answers were reviewed by a board certified advanced clinical practitioner to complete your personal care plan. Depending upon the condition, your plan could have included both over the counter or prescription medications.  Please review your pharmacy choice. Make sure the pharmacy is open so you can pick up prescription now. If there is a problem, you may contact your provider through and have the prescription routed to another pharmacy.  Your safety is important to [employer]. If you have drug allergies check your prescription carefully.   For the next 24 hours you can use MyChart to ask questions about  today's visit, request a non-urgent call back, or ask for a work or school excuse. You will get an email in the next two days asking about your experience. I hope that your e-visit has been valuable and will speed your recovery.  Greater than 5 minutes, yet less than 10 minutes of time have been spent researching, coordinating, and  implementing care for this patient today

## 2021-09-30 ENCOUNTER — Encounter: Payer: Self-pay | Admitting: Internal Medicine

## 2021-09-30 ENCOUNTER — Ambulatory Visit: Payer: BC Managed Care – PPO | Admitting: Internal Medicine

## 2021-09-30 ENCOUNTER — Other Ambulatory Visit: Payer: Self-pay

## 2021-09-30 VITALS — BP 112/64 | HR 72 | Temp 97.9°F | Ht 70.39 in | Wt 175.4 lb

## 2021-09-30 DIAGNOSIS — Z13220 Encounter for screening for lipoid disorders: Secondary | ICD-10-CM

## 2021-09-30 DIAGNOSIS — R739 Hyperglycemia, unspecified: Secondary | ICD-10-CM

## 2021-09-30 DIAGNOSIS — F419 Anxiety disorder, unspecified: Secondary | ICD-10-CM

## 2021-09-30 DIAGNOSIS — E559 Vitamin D deficiency, unspecified: Secondary | ICD-10-CM

## 2021-09-30 DIAGNOSIS — Z13818 Encounter for screening for other digestive system disorders: Secondary | ICD-10-CM

## 2021-09-30 DIAGNOSIS — F32A Depression, unspecified: Secondary | ICD-10-CM

## 2021-09-30 DIAGNOSIS — Z0000 Encounter for general adult medical examination without abnormal findings: Secondary | ICD-10-CM

## 2021-09-30 DIAGNOSIS — F439 Reaction to severe stress, unspecified: Secondary | ICD-10-CM

## 2021-09-30 DIAGNOSIS — F5104 Psychophysiologic insomnia: Secondary | ICD-10-CM

## 2021-09-30 DIAGNOSIS — Z1329 Encounter for screening for other suspected endocrine disorder: Secondary | ICD-10-CM

## 2021-09-30 DIAGNOSIS — Z1389 Encounter for screening for other disorder: Secondary | ICD-10-CM

## 2021-09-30 MED ORDER — ESCITALOPRAM OXALATE 5 MG PO TABS: 5.0000 mg | ORAL_TABLET | Freq: Every day | ORAL | 3 refills | Status: DC

## 2021-09-30 MED ORDER — ALBUTEROL SULFATE HFA 108 (90 BASE) MCG/ACT IN AERS: 2.0000 | INHALATION_SPRAY | RESPIRATORY_TRACT | 0 refills | Status: DC | PRN

## 2021-09-30 NOTE — Progress Notes (Signed)
Chief Complaint  ?Patient presents with  ? Establish Care  ? ?New patient  ?1. Insomnia x 1 year sleeping 2-3 hours at a time due to stress nothing tried phq 9 score 4 and gad 7 score 5  ? ? ?Review of Systems  ?Constitutional:  Negative for weight loss.  ?HENT:  Negative for hearing loss.   ?Eyes:  Negative for blurred vision.  ?Respiratory:  Negative for shortness of breath.   ?Cardiovascular:  Negative for chest pain.  ?Gastrointestinal:  Negative for abdominal pain and blood in stool.  ?Genitourinary:  Negative for dysuria.  ?Musculoskeletal:  Negative for falls and joint pain.  ?Skin:  Negative for rash.  ?Neurological:  Negative for headaches.  ?Psychiatric/Behavioral:  Negative for depression.   ?Past Medical History:  ?Diagnosis Date  ? Bronchitis   ? Chicken pox   ? COVID-19   ? 02/2021  ? History of substance abuse ([place]) 09/16/2015  ? ?Past Surgical History:  ?Procedure Laterality Date  ? [place]  ? VASECTOMY    ? 2019  ? ?Family History  ?Problem Relation Age of Onset  ? Cancer Mother   ?     skin cancer  ? Hyperlipidemia Father   ? Heart disease Father   ?     MI x 3  ? ?Social History  ? ?Socioeconomic History  ? Marital status: Married  ?  Spouse name: Not on file  ? Number of children: Not on file  ? Years of education: Not on file  ? Highest education level: Not on file  ?Occupational History  ? Not on file  ?Tobacco Use  ? Smoking status: Never  ?  Passive exposure: Never  ? Smokeless tobacco: Never  ?Vaping Use  ? Vaping Use: Never used  ?Substance and Sexual Activity  ? Alcohol use: Yes  ?  Comment: occasionally  ? Drug use: Not Currently  ? Sexual activity: Yes  ?  Birth control/protection: Surgical  ?  Comment: Vas 03/23/2018  ?Other Topics Concern  ? Not on file  ?Social History Narrative  ? 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 2 kids  ? High school education   ? Married   ? ?Social Determinants of Health  ? ?Financial Resource Strain: Not on file  ?Food Insecurity: Not on file   ?Transportation Needs: Not on file  ?Physical Activity: Not on file  ?Stress: Not on file  ?Social Connections: Not on file  ?Intimate Partner Violence: Not on file  ? ?Current Meds  ?Medication Sig  ? escitalopram (LEXAPRO) 5 MG tablet Take 1 tablet (5 mg total) by mouth daily.  ? ?Allergies  ?Allergen Reactions  ? Latex Rash  ? Nickel Rash  ? ?No results found for this or any previous visit (from the past 2160 hour(s)). ?Objective  ?Body mass index is 24.89 kg/m?. ?Wt Readings from Last 3 Encounters:  ?09/30/21 175 lb 6.4 oz (79.6 kg)  ?04/19/21 168 lb (76.2 kg)  ?12/26/20 160 lb 11.5 oz (72.9 kg)  ? ?Temp Readings from Last 3 Encounters:  ?09/30/21 97.9 ?F (36.6 ?C) (Oral)  ?04/19/21 98.9 ?F (37.2 ?C) (Oral)  ?12/26/20 98.4 ?F (36.9 ?C) (Oral)  ? ?BP Readings from Last 3 Encounters:  ?09/30/21 112/64  ?04/19/21 127/87  ?12/26/20 124/83  ? ?Pulse Readings from Last 3 Encounters:  ?09/30/21 72  ?04/19/21 66  ?12/26/20 79  ? ? ?Physical Exam ?Vitals and nursing note reviewed.  ?Constitutional:   ?  Appearance: Normal appearance. He is well-developed and well-groomed.  ?HENT:  ?   Head: Normocephalic and atraumatic.  ?Eyes:  ?   Conjunctiva/sclera: Conjunctivae normal.  ?   Pupils: Pupils are equal, round, and reactive to light.  ?Cardiovascular:  ?   Rate and Rhythm: Normal rate and regular rhythm.  ?   Heart sounds: Normal heart sounds.  ?Pulmonary:  ?   Effort: Pulmonary effort is normal. No respiratory distress.  ?   Breath sounds: Normal breath sounds.  ?Abdominal:  ?   Tenderness: There is no abdominal tenderness.  ?Skin: ?   General: Skin is warm and moist.  ?Neurological:  ?   General: No focal deficit present.  ?   Mental Status: He is alert and oriented to person, place, and time. Mental status is at baseline.  ?   Sensory: Sensation is intact.  ?   Motor: Motor function is intact.  ?   Coordination: Coordination is intact.  ?   Gait: Gait is intact. Gait normal.  ?Psychiatric:     ?   Attention and  Perception: Attention and perception normal.     ?   Mood and Affect: Mood and affect normal.     ?   Speech: Speech normal.     ?   Behavior: Behavior normal. Behavior is cooperative.     ?   Thought Content: Thought content normal.     ?   Cognition and Memory: Cognition and memory normal.     ?   Judgment: Judgment normal.  ? ? ?Assessment  ?Plan  ?Chronic insomnia ?Stress - Plan: escitalopram (LEXAPRO) 5 MG tablet ?Anxiety and depression - Plan: escitalopram (LEXAPRO) 5 MG tablet ? ?Hyperglycemia - Plan: Hemoglobin A1c  ? ?HM-CPE at f/u ?Flu shot declines ?Tdap utd  ?Declines covid 19  ?Psa 01/01/21 0.61  ?Colonoscopy age 55  ?Dentist Ruthy Dick  ?[facility] Dr. Eula Flax  ? ?Provider: Dr. Olivia Mackie McLean-Scocuzza-Internal Medicine  ?

## 2021-09-30 NOTE — Patient Instructions (Addendum)
Tranquil sleep stress relax brand * (amazon or whole foods)  Melatonin 5-10 mg over the counter at night 1 hour before bed   Or    [employer] as given the info before for both  [facility] [place]  [place] [facility] [place]  [address]  [place] [address]  [phone]    Escitalopram Tablets What is this medication? ESCITALOPRAM (es sye TAL oh pram) treats depression and anxiety. It increases the amount of serotonin in the brain, a hormone that helps regulate mood. It belongs to a group of medications called SSRIs. This medicine may be used for other purposes; ask your health care provider or pharmacist if you have questions. COMMON BRAND NAME(S): Lexapro What should I tell my care team before I take this medication? They need to know if you have any of these conditions: Bipolar disorder or a family history of bipolar disorder Diabetes Glaucoma Heart disease Kidney or liver disease Receiving electroconvulsive therapy Seizures Suicidal thoughts, plans, or attempt by you or a family member An unusual or allergic reaction to escitalopram, the related medication citalopram, other medications, foods, dyes, or preservatives Pregnant or trying to become pregnant Breast-feeding How should I use this medication? Take this medication by mouth with a glass of water. Follow the directions on the prescription label. You can take it with or without food. If it upsets your stomach, take it with food. Take your medication at regular intervals. Do not take it more often than directed. Do not stop taking this medication suddenly except upon the advice of your care team. Stopping this medication too quickly may cause serious side effects or your condition may worsen. A special MedGuide will be given to you by the pharmacist with each prescription and refill. Be sure  to read this information carefully each time. Talk to your care team regarding the use of this medication in children. Special care may be needed. Overdosage: If you think you have taken too much of this medicine contact a poison control center or emergency room at once. NOTE: This medicine is only for you. Do not share this medicine with others. What if I miss a dose? If you miss a dose, take it as soon as you can. If it is almost time for your next dose, take only that dose. Do not take double or extra doses. What may interact with this medication? Do not take this medication with any of the following: Certain medications for fungal infections like fluconazole, itraconazole, ketoconazole, posaconazole, voriconazole Cisapride Citalopram Dronedarone Linezolid MAOIs like Carbex, Eldepryl, Marplan, Nardil, and Parnate Methylene blue (injected into a vein) Pimozide Thioridazine This medication may also interact with the following: Alcohol Amphetamines Aspirin and aspirin-like medications Carbamazepine Certain medications for depression, anxiety, or psychotic disturbances Certain medications for migraine headache like almotriptan, eletriptan, frovatriptan, naratriptan, rizatriptan, sumatriptan, zolmitriptan Certain medications for sleep Certain medications that treat or prevent blood clots like warfarin, enoxaparin, dalteparin Cimetidine Diuretics Dofetilide Fentanyl Furazolidone Isoniazid Lithium Metoprolol NSAIDs, medications for pain and inflammation, like ibuprofen or naproxen Other medications that prolong the QT interval (cause an abnormal heart rhythm) Procarbazine Rasagiline Supplements like St. John's wort, kava kava, valerian Tramadol Tryptophan Ziprasidone This list may not describe all possible interactions. Give your health care provider a list of all the medicines, herbs, non-prescription drugs, or dietary supplements you use. Also tell them if you smoke, drink  alcohol, or use illegal drugs. Some items may interact with your medicine. What should I watch for while using this medication? Tell your care team if your symptoms do not get better or if they get worse. Visit your care team for regular checks on your progress. Because it may take several weeks to see the full effects of this medication, it is important to continue your treatment as prescribed by your care team. Watch for new or worsening thoughts of suicide or depression. This includes sudden changes in mood, behaviors, or thoughts. These changes can happen at any time but are more common in the beginning of treatment or after a change in dose. Call your care team right away if you experience these thoughts or worsening depression. Manic episodes may happen in patients with bipolar disorder who take this medication. Watch for changes in feelings or behaviors such as feeling anxious, nervous, agitated, panicky, irritable, hostile, aggressive, impulsive, severely restless, overly excited and hyperactive, or trouble sleeping. These symptoms can happen at any time but are more common in the beginning of treatment or after a change in dose. Call your care team right away if you notice any of these symptoms. You may get drowsy or dizzy. Do not drive, use machinery, or do anything that needs mental alertness until you know how this medication affects you. Do not stand or sit up quickly, especially if you are an older patient. This reduces the risk of dizzy or fainting spells. Alcohol may interfere with the effect of this medication. Avoid alcoholic drinks. Your mouth may get dry. Chewing sugarless gum or sucking hard candy, and drinking plenty of water may help. Contact your care team if the problem does not go away or is severe. What side effects may I notice from receiving this medication? Side effects that you should report to your care team as soon as possible: Allergic reactions--skin rash, itching, hives,  swelling of the face, lips, tongue, or throat Bleeding--bloody or black, tar-like stools, red or dark brown urine, vomiting blood or brown material that looks like coffee grounds, small, red or purple spots on skin, unusual bleeding or bruising Heart rhythm changes--fast or irregular heartbeat, dizziness, feeling faint or lightheaded, chest pain, trouble breathing Low sodium level--muscle weakness, fatigue, dizziness, headache, confusion Serotonin syndrome--irritability, confusion, fast or irregular heartbeat, muscle stiffness, twitching muscles, sweating, high fever, seizure, chills, vomiting, diarrhea Sudden eye pain or change in vision such as blurry vision, seeing halos around lights, vision loss Thoughts of suicide or self-harm, worsening mood, feelings of depression Side effects that usually do not require medical attention (report to your care team if they continue or are bothersome): Change in sex drive or performance Diarrhea Excessive sweating Nausea Tremors or shaking Upset stomach This list may not describe all possible side effects. Call your doctor for medical advice about side effects. You may report side effects to FDA at 1-800-FDA-1088. Where should I keep my medication? Keep out of reach of children and pets. Store at room temperature between 15 and 30 degrees C (59 and 86 degrees F). Throw away any unused medication after the expiration date. NOTE: This sheet is a summary. It may not cover all possible information. If you have questions about this medicine, talk to your doctor, pharmacist, or health care provider.  2022 Elsevier/Gold Standard (2020-07-01 00:00:00)  Mindfulness-Based Stress Reduction Mindfulness-based stress reduction (MBSR) is a program that helps people learn to practice mindfulness. Mindfulness is the practice of consciously paying attention to the present moment.  MBSR focuses on developing self-awareness, which lets you respond to life stress without  judgment or negative feelings. It can be learned and practiced through techniques such as education, breathing exercises, meditation, and yoga. MBSR includes several mindfulness techniques in one program. MBSR works best when you understand the treatment, are willing to try new things, and can commit to spending time practicing what you learn. MBSR training may include learning about: How your feelings, thoughts, and reactions affect your body. New ways to respond to things that cause negative thoughts to start (triggers). How to notice your thoughts and let go of them. Practicing awareness of everyday things that you normally do without thinking. The techniques and goals of different types of meditation. What are the benefits of MBSR? MBSR can have many benefits, which include helping you to: Develop self-awareness. This means knowing and understanding yourself. Learn skills and attitudes that help you to take part in your own health care. Learn new ways to care for yourself. Be more accepting about how things are, and let things go. Be less judgmental and approach things with an open mind. Be patient with yourself and trust yourself more. MBSR has also been shown to: Reduce negative emotions, such as sadness, overwhelm, and worry. Improve memory and focus. Change how you sense and react to pain. Boost your body's ability to fight infections. Help you connect better with other people. Improve your sense of well-being. How to practice mindfulness To do a basic awareness exercise: Find a comfortable place to sit. Pay attention to the present moment. Notice your thoughts, feelings, and surroundings just as they are. Avoid judging yourself, your feelings, or your surroundings. Make note of any judgment that comes up and let it go. Your mind may wander, and that is okay. Make note of when your thoughts drift, and return your attention to the present moment. To do basic mindfulness  meditation: Find a comfortable place to sit. This may include a stable chair or a firm floor cushion. Sit upright with your back straight. Let your arms fall next to your sides, with your hands resting on your legs. If you are sitting in a chair, rest your feet flat on the floor. If you are sitting on a cushion, cross your legs in front of you. Keep your head in a neutral position with your chin dropped slightly. Relax your jaw and rest the tip of your tongue on the roof of your mouth. Drop your gaze to the floor or close your eyes. Breathe normally and pay attention to your breath. Feel the air moving in and out of your nose. Feel your belly expanding and relaxing with each breath. Your mind may wander, and that is okay. Make note of when your thoughts drift, and return your attention to your breath. Avoid judging yourself, your feelings, or your surroundings. Make note of any judgment or feelings that come up, let them go, and bring your attention back to your breath. When you are ready, lift your gaze or open your eyes. Pay attention to how your body feels after the meditation. Follow these instructions at home:  Find a local in-person or online MBSR program. Set aside some time regularly for mindfulness practice. Practice every day if you can. Even 10 minutes of practice is helpful. Find a mindfulness practice that works best for you. This may include one or more of the following: Meditation. This involves focusing your mind on a certain thought or activity. Breathing awareness exercises. These help you  to stay present by focusing on your breath. Body scan. For this practice, you lie down and pay attention to each part of your body from head to toe. You can identify tension and soreness and consciously relax parts of your body. Yoga. Yoga involves stretching and breathing, and it can improve your ability to move and be flexible. It can also help you to test your body's limits, which can help  you release stress. Mindful eating. This way of eating involves focusing on the taste, texture, color, and smell of each bite of food. This slows down eating and helps you feel full sooner. For this reason, it can be an important part of a weight loss plan. Find a podcast or recording that provides guidance for breathing awareness, body scan, or meditation exercises. You can listen to these any time when you have a free moment to rest without distractions. Follow your treatment plan as told by your health care provider. This may include taking regular medicines and making changes to your diet or lifestyle as recommended. Where to find more information You can find more information about MBSR from: Your health care provider. Community-based meditation centers or programs. Programs offered near you. Summary Mindfulness-based stress reduction (MBSR) is a program that teaches you how to consciously pay attention to the present moment. It is used to help you deal better with daily stress, feelings, and pain. MBSR focuses on developing self-awareness, which allows you to respond to life stress without judgment or negative feelings. MBSR programs may involve learning different mindfulness practices, such as breathing exercises, meditation, yoga, body scan, or mindful eating. Find a mindfulness practice that works best for you, and set aside time for it on a regular basis. This information is not intended to replace advice given to you by your health care provider. Make sure you discuss any questions you have with your health care provider. Document Revised: 02/20/2021 Document Reviewed: 02/20/2021 Elsevier Patient Education  2022 [employer], Adult Stress is a normal reaction to life events. Stress is what you feel when life demands more than you are used to, or more than you think you can handle. Some stress can be useful, such as studying for a test or meeting a deadline at work. Stress that  occurs too often or for too long can cause problems. Long-lasting stress is called chronic stress. Chronic stress can affect your emotional health and interfere with relationships and normal daily activities. Too much stress can weaken your body's defense system (immune system) and increase your risk for physical illness. If you already have a medical problem, stress can make it worse. What are the causes? All sorts of life events can cause stress. An event that causes stress for one person may not be stressful for someone else. Major life events, whether positive or negative, commonly cause stress. Examples include: Losing a job or starting a new job. Losing a loved one. Moving to a new town or home. Getting married or divorced. Having a baby. Getting injured or sick. Less obvious life events can also cause stress, especially if they occur day after day or in combination with each other. Examples include: Working long hours. Driving in traffic. Caring for children. Being in debt. Being in a difficult relationship. What are the signs or symptoms? Stress can cause emotional and physical symptoms and can lead to unhealthy behaviors. These include the following: Emotional symptoms Anxiety. This is feeling worried, afraid, on edge, overwhelmed, or out of control. Anger,  including irritation or impatience. Depression. This is feeling sad, down, helpless, or guilty. Trouble focusing, remembering, or making decisions. Physical symptoms Aches and pains. These may affect your head, neck, back, stomach, or other areas of your body. Tight muscles or a clenched jaw. Low energy. Trouble sleeping. Unhealthy behaviors Eating to feel better (overeating) or skipping meals. Working too much or putting off tasks. Smoking, drinking alcohol, or using drugs to feel better. How is this diagnosed? A stress disorder is diagnosed through an assessment by your health care provider. A stress disorder may be  diagnosed based on: Your symptoms and any stressful life events. Your medical history. Tests to rule out other causes of your symptoms. Depending on your condition, your health care provider may refer you to a specialist for further evaluation. How is this treated? Stress management techniques are the recommended treatment for stress. Medicine is not typically recommended for treating stress. Techniques to reduce your reaction to stressful life events include: Identifying stress. Monitor yourself for symptoms of stress and notice what causes stress for you. These skills may help you to avoid or prepare for stressful events. Managing time. Set your priorities, keep a calendar of events, and learn to say no. These actions can help you avoid taking on too much. Techniques for dealing with stress include: Rethinking the problem. Try to think realistically about stressful events rather than ignoring them or overreacting. Try to find the positives in a stressful situation rather than focusing on the negatives. Exercise. Physical exercise can release both physical and emotional tension. The key is to find a form of exercise that you enjoy and do it regularly. Relaxation techniques. These relax the body and mind. Find one or more that you enjoy and use the techniques regularly. Examples include: Meditation, deep breathing, or progressive relaxation techniques. Yoga or tai chi. Biofeedback, mindfulness techniques, or journaling. Listening to music, being in nature, or taking part in other hobbies. Practicing a healthy lifestyle. Eat a balanced diet, drink plenty of water, limit or avoid caffeine, and get plenty of sleep. Having a strong support network. Spend time with family, friends, or other people you enjoy being around. Express your feelings and talk things over with someone you trust. Counseling or talk therapy with a mental health provider may help if you are having trouble managing stress by  yourself. Follow these instructions at home: Lifestyle  Avoid drugs. Do not use any products that contain nicotine or tobacco. These products include cigarettes, chewing tobacco, and vaping devices, such as e-cigarettes. If you need help quitting, ask your health care provider. If you drink alcohol: Limit how much you have to: 0-1 drink a day for women who are not pregnant. 0-2 drinks a day for men. Know how much alcohol is in a drink. In the [place] one drink equals one 12 oz bottle of beer (355 mL), one 5 oz glass of wine (148 mL), or one 1 oz glass of hard liquor (44 mL). Do not use alcohol or drugs to relax. Eat a balanced diet that includes fresh fruits and vegetables, whole grains, lean meats, fish, eggs, beans, and low-fat dairy. Avoid processed foods and foods high in added fat, sugar, and salt. Exercise at least 30 minutes on 5 or more days each week. Get 7-8 hours of sleep each night. General instructions  Practice stress management techniques as told by your health care provider. Drink enough fluid to keep your urine pale yellow. Take over-the-counter and prescription medicines only as told by  your health care provider. Keep all follow-up visits. This is important. Contact a health care provider if: Your symptoms get worse. You have new symptoms. You feel overwhelmed by your problems and can no longer manage them by yourself. Get help right away if: You have thoughts of hurting yourself or others. Get help right awayif you feel like you may hurt yourself or others, or have thoughts about taking your own life. Go to your nearest emergency room or: Call 911. Call the [facility] at ([phone] or 988. This is open 24 hours a day. Text the Crisis Text Line at 641-092-9387. Summary Stress is a normal reaction to life events. It can cause problems if it happens too often or for too long. Practicing stress management techniques is the best way to treat  stress. Counseling or talk therapy with a mental health provider may help if you are having trouble managing stress by yourself. This information is not intended to replace advice given to you by your health care provider. Make sure you discuss any questions you have with your health care provider. Document Revised: 02/20/2021 Document Reviewed: 02/20/2021 Elsevier Patient Education  2022 [employer].  Insomnia Insomnia is a sleep disorder that makes it difficult to fall asleep or stay asleep. Insomnia can cause fatigue, low energy, difficulty concentrating, mood swings, and poor performance at work or school. There are three different ways to classify insomnia: Difficulty falling asleep. Difficulty staying asleep. Waking up too early in the morning. Any type of insomnia can be long-term (chronic) or short-term (acute). Both are common. Short-term insomnia usually lasts for three months or less. Chronic insomnia occurs at least three times a week for longer than three months. What are the causes? Insomnia may be caused by another condition, situation, or substance, such as: Anxiety. Certain medicines. Gastroesophageal reflux disease (GERD) or other gastrointestinal conditions. Asthma or other breathing conditions. Restless legs syndrome, sleep apnea, or other sleep disorders. Chronic pain. Menopause. Stroke. Abuse of alcohol, tobacco, or illegal drugs. Mental health conditions, such as depression. Caffeine. Neurological disorders, such as Alzheimer's disease. An overactive thyroid (hyperthyroidism). Sometimes, the cause of insomnia may not be known. What increases the risk? Risk factors for insomnia include: Gender. Women are affected more often than men. Age. Insomnia is more common as you get older. Stress. Lack of exercise. Irregular work schedule or working night shifts. Traveling between different time zones. Certain medical and mental health conditions. What are the  signs or symptoms? If you have insomnia, the main symptom is having trouble falling asleep or having trouble staying asleep. This may lead to other symptoms, such as: Feeling fatigued or having low energy. Feeling nervous about going to sleep. Not feeling rested in the morning. Having trouble concentrating. Feeling irritable, anxious, or depressed. How is this diagnosed? This condition may be diagnosed based on: Your symptoms and medical history. Your health care provider may ask about: Your sleep habits. Any medical conditions you have. Your mental health. A physical exam. How is this treated? Treatment for insomnia depends on the cause. Treatment may focus on treating an underlying condition that is causing insomnia. Treatment may also include: Medicines to help you sleep. Counseling or therapy. Lifestyle adjustments to help you sleep better. Follow these instructions at home: Eating and drinking  Limit or avoid alcohol, caffeinated beverages, and cigarettes, especially close to bedtime. These can disrupt your sleep. Do not eat a large meal or eat spicy foods right before bedtime. This can lead to  digestive discomfort that can make it hard for you to sleep. Sleep habits  Keep a sleep diary to help you and your health care provider figure out what could be causing your insomnia. Write down: When you sleep. When you wake up during the night. How well you sleep. How rested you feel the next day. Any side effects of medicines you are taking. What you eat and drink. Make your bedroom a dark, comfortable place where it is easy to fall asleep. Put up shades or blackout curtains to block light from outside. Use a white noise machine to block noise. Keep the temperature cool. Limit screen use before bedtime. This includes: Watching TV. Using your smartphone, tablet, or computer. Stick to a routine that includes going to bed and waking up at the same times every day and night. This  can help you fall asleep faster. Consider making a quiet activity, such as reading, part of your nighttime routine. Try to avoid taking naps during the day so that you sleep better at night. Get out of bed if you are still awake after 15 minutes of trying to sleep. Keep the lights down, but try reading or doing a quiet activity. When you feel sleepy, go back to bed. General instructions Take over-the-counter and prescription medicines only as told by your health care provider. Exercise regularly, as told by your health care provider. Avoid exercise starting several hours before bedtime. Use relaxation techniques to manage stress. Ask your health care provider to suggest some techniques that may work well for you. These may include: Breathing exercises. Routines to release muscle tension. Visualizing peaceful scenes. Make sure that you drive carefully. Avoid driving if you feel very sleepy. Keep all follow-up visits as told by your health care provider. This is important. Contact a health care provider if: You are tired throughout the day. You have trouble in your daily routine due to sleepiness. You continue to have sleep problems, or your sleep problems get worse. Get help right away if: You have serious thoughts about hurting yourself or someone else. If you ever feel like you may hurt yourself or others, or have thoughts about taking your own life, get help right away. You can go to your nearest emergency department or call: Your local emergency services (911 in the [place] A suicide crisis helpline, such as the [facility] at (501)375-0945 or 988 in the [place]. This is open 24 hours a day. Summary Insomnia is a sleep disorder that makes it difficult to fall asleep or stay asleep. Insomnia can be long-term (chronic) or short-term (acute). Treatment for insomnia depends on the cause. Treatment may focus on treating an underlying condition that is causing  insomnia. Keep a sleep diary to help you and your health care provider figure out what could be causing your insomnia. This information is not intended to replace advice given to you by your health care provider. Make sure you discuss any questions you have with your health care provider. Document Revised: 02/05/2021 Document Reviewed: 05/23/2020 Elsevier Patient Education  2022 [employer].

## 2021-12-25 ENCOUNTER — Ambulatory Visit: Payer: 59 | Admitting: Family Medicine

## 2022-01-01 ENCOUNTER — Other Ambulatory Visit: Payer: Self-pay | Admitting: Lab

## 2022-01-01 ENCOUNTER — Other Ambulatory Visit (INDEPENDENT_AMBULATORY_CARE_PROVIDER_SITE_OTHER): Payer: BC Managed Care – PPO

## 2022-01-01 DIAGNOSIS — E559 Vitamin D deficiency, unspecified: Secondary | ICD-10-CM

## 2022-01-01 DIAGNOSIS — Z13220 Encounter for screening for lipoid disorders: Secondary | ICD-10-CM

## 2022-01-01 DIAGNOSIS — Z0000 Encounter for general adult medical examination without abnormal findings: Secondary | ICD-10-CM

## 2022-01-01 DIAGNOSIS — F5104 Psychophysiologic insomnia: Secondary | ICD-10-CM

## 2022-01-01 DIAGNOSIS — Z1389 Encounter for screening for other disorder: Secondary | ICD-10-CM

## 2022-01-01 DIAGNOSIS — R739 Hyperglycemia, unspecified: Secondary | ICD-10-CM

## 2022-01-01 DIAGNOSIS — Z1329 Encounter for screening for other suspected endocrine disorder: Secondary | ICD-10-CM

## 2022-01-01 DIAGNOSIS — Z13818 Encounter for screening for other digestive system disorders: Secondary | ICD-10-CM

## 2022-01-01 NOTE — Addendum Note (Signed)
Addended by: Clearnce Sorrel on: 01/01/2022 02:47 PM   Modules accepted: Orders

## 2022-01-02 LAB — COMPREHENSIVE METABOLIC PANEL
ALT: 23 U/L (ref 0–53)
AST: 22 U/L (ref 0–37)
Albumin: 4.4 g/dL (ref 3.5–5.2)
Alkaline Phosphatase: 50 U/L (ref 39–117)
BUN: 11 mg/dL (ref 6–23)
CO2: 28 mEq/L (ref 19–32)
Calcium: 9.5 mg/dL (ref 8.4–10.5)
Chloride: 102 mEq/L (ref 96–112)
Creatinine, Ser: 0.86 mg/dL (ref 0.40–1.50)
GFR: 106.78 mL/min (ref >60.00)
Glucose, Bld: 90 mg/dL (ref 70–99)
Potassium: 4.2 mEq/L (ref 3.5–5.1)
Sodium: 137 mEq/L (ref 135–145)
Total Bilirubin: 0.8 mg/dL (ref 0.2–1.2)
Total Protein: 6.4 g/dL (ref 6.0–8.3)

## 2022-01-02 LAB — LIPID PANEL
Cholesterol: 159 mg/dL (ref 0–200)
HDL: 70.5 mg/dL (ref >39.00)
LDL Cholesterol: 78 mg/dL (ref 0–99)
NonHDL: 88.72
Total CHOL/HDL Ratio: 2
Triglycerides: 56 mg/dL (ref 0.0–149.0)
VLDL: 11.2 mg/dL (ref 0.0–40.0)

## 2022-01-02 LAB — URINALYSIS, ROUTINE W REFLEX MICROSCOPIC
Bilirubin Urine: NEGATIVE
Hgb urine dipstick: NEGATIVE
Ketones, ur: NEGATIVE
Leukocytes,Ua: NEGATIVE
Nitrite: NEGATIVE
Specific Gravity, Urine: 1.01 (ref 1.000–1.030)
Total Protein, Urine: NEGATIVE
Urine Glucose: NEGATIVE
Urobilinogen, UA: 0.2 (ref 0.0–1.0)
pH: 7 (ref 5.0–8.0)

## 2022-01-02 LAB — HEPATITIS C ANTIBODY
Hepatitis C Ab: NONREACTIVE
SIGNAL TO CUT-OFF: 0.03 (ref <1.00)

## 2022-01-02 LAB — CBC WITH DIFFERENTIAL/PLATELET
Basophils Absolute: 0 10*3/uL (ref 0.0–0.1)
Basophils Relative: 0.7 % (ref 0.0–3.0)
Eosinophils Absolute: 0.2 10*3/uL (ref 0.0–0.7)
Eosinophils Relative: 2.9 % (ref 0.0–5.0)
HCT: 41.9 % (ref 39.0–52.0)
Hemoglobin: 14.4 g/dL (ref 13.0–17.0)
Lymphocytes Relative: 34.4 % (ref 12.0–46.0)
Lymphs Abs: 1.9 10*3/uL (ref 0.7–4.0)
MCHC: 34.3 g/dL (ref 30.0–36.0)
MCV: 97.7 fl (ref 78.0–100.0)
Monocytes Absolute: 0.4 10*3/uL (ref 0.1–1.0)
Monocytes Relative: 8.1 % (ref 3.0–12.0)
Neutro Abs: 2.9 10*3/uL (ref 1.4–7.7)
Neutrophils Relative %: 53.9 % (ref 43.0–77.0)
Platelets: 225 10*3/uL (ref 150.0–400.0)
RBC: 4.29 Mil/uL (ref 4.22–5.81)
RDW: 13 % (ref 11.5–15.5)
WBC: 5.4 10*3/uL (ref 4.0–10.5)

## 2022-01-02 LAB — VITAMIN D 25 HYDROXY (VIT D DEFICIENCY, FRACTURES): VITD: 36.23 ng/mL (ref 30.00–100.00)

## 2022-01-02 LAB — TSH: TSH: 1.1 u[IU]/mL (ref 0.35–5.50)

## 2022-01-02 LAB — HEMOGLOBIN A1C: Hgb A1c MFr Bld: 5 % (ref 4.6–6.5)

## 2022-01-03 ENCOUNTER — Emergency Department
Admission: EM | Admit: 2022-01-03 | Discharge: 2022-01-03 | Disposition: A | Discharge status: Home / Self Care | Payer: BC Managed Care – PPO | Attending: Emergency Medicine | Admitting: Emergency Medicine

## 2022-01-03 ENCOUNTER — Emergency Department: Payer: BC Managed Care – PPO

## 2022-01-03 DIAGNOSIS — S025XXA Fracture of tooth (traumatic), initial encounter for closed fracture: Secondary | ICD-10-CM | POA: Diagnosis not present

## 2022-01-03 DIAGNOSIS — W1839XA Other fall on same level, initial encounter: Secondary | ICD-10-CM | POA: Diagnosis not present

## 2022-01-03 DIAGNOSIS — Y909 Presence of alcohol in blood, level not specified: Secondary | ICD-10-CM | POA: Insufficient documentation

## 2022-01-03 DIAGNOSIS — S0993XA Unspecified injury of face, initial encounter: Secondary | ICD-10-CM | POA: Diagnosis present

## 2022-01-03 DIAGNOSIS — S00531A Contusion of lip, initial encounter: Secondary | ICD-10-CM | POA: Insufficient documentation

## 2022-01-03 DIAGNOSIS — F10121 Alcohol abuse with intoxication delirium: Secondary | ICD-10-CM | POA: Insufficient documentation

## 2022-01-03 DIAGNOSIS — F10921 Alcohol use, unspecified with intoxication delirium: Secondary | ICD-10-CM

## 2022-01-03 LAB — URINE DRUG SCREEN, QUALITATIVE (ARMC ONLY)
Amphetamines, Ur Screen: NOT DETECTED
Barbiturates, Ur Screen: NOT DETECTED
Benzodiazepine, Ur Scrn: NOT DETECTED
Cannabinoid 50 Ng, Ur ~~LOC~~: NOT DETECTED
Cocaine Metabolite,Ur ~~LOC~~: NOT DETECTED
MDMA (Ecstasy)Ur Screen: NOT DETECTED
Methadone Scn, Ur: NOT DETECTED
Opiate, Ur Screen: NOT DETECTED
Phencyclidine (PCP) Ur S: NOT DETECTED
Tricyclic, Ur Screen: NOT DETECTED

## 2022-01-03 NOTE — ED Provider Notes (Signed)
[facility] Provider Note   Event Date/Time   First MD Initiated Contact with Patient 01/03/22 [phone]     (approximate) History  Alcohol Intoxication  HPI Tim Ramirez is a 41 y.o. male with a past medical history of alcohol abuse who presents via EMS after being intoxicated falling straight forward onto his face.  Patient arrives intoxicated and unable to answer further history review of systems questions secondary mental status   Physical Exam  Triage Vital Signs: ED Triage Vitals  Enc Vitals Group     BP 01/03/22 1858 117/81     Pulse Rate 01/03/22 1858 86     Resp 01/03/22 1858 17     Temp 01/03/22 1858 98.7 F (37.1 C)     Temp Source 01/03/22 1858 Oral     SpO2 01/03/22 1858 98 %     Weight 01/03/22 1856 162 lb (73.5 kg)     Height --      Head Circumference --      Peak Flow --      Pain Score 01/03/22 1856 0     Pain Loc --      Pain Edu? --      Excl. in GC? --    Most recent vital signs: Vitals:   01/03/22 1858 01/03/22 2120  BP: 117/81 119/78  Pulse: 86 88  Resp: 17 17  Temp: 98.7 F (37.1 C)   SpO2: 98% 98%   General: Awake, oriented x4. CV:  Good peripheral perfusion.  Resp:  Normal effort.  Abd:  No distention.  Other:  Middle-aged Caucasian male laying in bed slurring speech with abrasion over the nasal bridge, multiple chipped teeth, and contusion to the upper lip ED Results / Procedures / Treatments  Labs (all labs ordered are listed, but only abnormal results are displayed) Labs Reviewed  URINE DRUG SCREEN, QUALITATIVE ([facility] ONLY)   RADIOLOGY ED MD interpretation: CT of the head without contrast interpreted by me shows no evidence of acute abnormalities including no intracerebral hemorrhage, obvious masses, or significant edema  CT of the maxillofacial structures without contrast interpreted by me and shows multiple small foci of facial soft tissue air -Agree with radiology assessment Official radiology  report(s): CT Maxillofacial Wo Contrast  Result Date: 01/03/2022 CLINICAL DATA:  Status post trauma. EXAM: CT MAXILLOFACIAL WITHOUT CONTRAST TECHNIQUE: Multidetector CT imaging of the maxillofacial structures was performed. Multiplanar CT image reconstructions were also generated. RADIATION DOSE REDUCTION: This exam was performed according to the departmental dose-optimization program which includes automated exposure control, adjustment of the mA and/or kV according to patient size and/or use of iterative reconstruction technique. COMPARISON:  None Available. FINDINGS: Osseous: No fracture or mandibular dislocation. No destructive process. Orbits: Negative. No traumatic or inflammatory finding. Sinuses: A 12 mm x 6 mm right maxillary sinus polyp versus mucous retention cyst is seen. Soft tissues: Small foci of soft tissue air are seen within the masticator space on the right and submandibular fossa, bilaterally. Tiny foci of air are also seen within the right temporal fossa and medial aspects of the middle cranial fossa, bilateral. Limited intracranial: No significant or unexpected finding. IMPRESSION: 1. No acute fracture or mandibular dislocation. 2. Multiple small foci of facial soft tissue air, as described above. Electronically Signed   By: Aram Candela M.D.   On: 01/03/2022 19:51   CT Head Wo Contrast  Result Date: 01/03/2022 CLINICAL DATA:  Status post fall. EXAM: CT HEAD WITHOUT CONTRAST TECHNIQUE: Contiguous axial  images were obtained from the base of the skull through the vertex without intravenous contrast. RADIATION DOSE REDUCTION: This exam was performed according to the departmental dose-optimization program which includes automated exposure control, adjustment of the mA and/or kV according to patient size and/or use of iterative reconstruction technique. COMPARISON:  None Available. FINDINGS: Brain: No evidence of acute infarction, hemorrhage, hydrocephalus, extra-axial collection or mass  lesion/mass effect. Vascular: No hyperdense vessel or unexpected calcification. Skull: Normal. Negative for fracture or focal lesion. Sinuses/Orbits: A 1.2 cm right maxillary sinus polyp versus mucous retention cyst is seen. Other: A small amount of right frontotemporal scalp soft tissue swelling is seen. IMPRESSION: 1. No acute intracranial abnormality. 2. Small amount of right frontotemporal scalp soft tissue swelling. Electronically Signed   By: Aram Candela M.D.   On: 01/03/2022 19:42   PROCEDURES: Critical Care performed: No .1-3 Lead EKG Interpretation  Performed by: Merwyn Katos, MD Authorized by: Merwyn Katos, MD     Interpretation: normal     ECG rate:  87   ECG rate assessment: normal     Rhythm: sinus rhythm     Ectopy: none     Conduction: normal    MEDICATIONS ORDERED IN ED: Medications - No data to display IMPRESSION / MDM / ASSESSMENT AND PLAN / ED COURSE  I reviewed the triage vital signs and the nursing notes.                             The patient is on the cardiac monitor to evaluate for evidence of arrhythmia and/or significant heart rate changes. Patient's presentation is most consistent with acute presentation with potential threat to life or bodily function. Presenting after a fall that occurred just prior to arrival, resulting in injury to the nose, upper lip, and teeth. The mechanism of injury was a mechanical ground level fall without syncope or near-syncope. The current level of pain is moderate. There was no loss of consciousness, confusion, seizure, or memory impairment. There is not a laceration associated with the injury. Denies neck pain. The patient does not take blood thinner medications. Denies vomiting, numbness/weakness, fever  +Slurred, sluggish behavior. Stated EtOH intoxication. Airway maintained. Unlikely intracranial bleed, opioid intoxication or coingestion, sepsis, hypothyroidism. Suspect likely transient course of intoxication  with expected  improvement of symptoms as patient metabolizes offending agent.  Plan: frequent reassessments  Reassessment Note: Time: 4 hours since initial presentation. Evaluation: Frequent mental status exams showed improving symptoms and evidence that the patients AMS was secondary to intoxication. Pt able to ambulate without difficulty and PO tolerant. Plan DC home with ride and return precautions.  Dispo: Discharge with PCP follow-up       FINAL CLINICAL IMPRESSION(S) / ED DIAGNOSES   Final diagnoses:  Alcohol intoxication with delirium (HCC)   Rx / DC Orders   ED Discharge Orders     None      Note:  This document was prepared using Dragon voice recognition software and may include unintentional dictation errors.   Merwyn Katos, MD 01/03/22 2352

## 2022-01-03 NOTE — ED Notes (Signed)
Pt ambulatory to bathroom with no assistance. Pt is A/Ox4 and has capacity. Pt is currently holding a sensible conversation with his wife and family member.

## 2022-01-03 NOTE — ED Triage Notes (Signed)
[place] was a community event going on where pt fell with his children on his shoulders. Pt has multiple broken teeth. Pt smells strongly of ETOH. Pt admits to drinking multiple beers and three shots of Crown.

## 2022-01-03 NOTE — ED Notes (Signed)
Pt wife has pt's necklace that is silver in color.

## 2022-01-06 ENCOUNTER — Telehealth: Payer: Self-pay | Admitting: Internal Medicine

## 2022-01-06 NOTE — Telephone Encounter (Signed)
Pt spouse called stating that she want the provider to look at his ER visit that he had on saturday

## 2022-01-07 ENCOUNTER — Encounter: Payer: Self-pay | Admitting: Internal Medicine

## 2022-01-07 ENCOUNTER — Ambulatory Visit (INDEPENDENT_AMBULATORY_CARE_PROVIDER_SITE_OTHER): Payer: BC Managed Care – PPO | Admitting: Internal Medicine

## 2022-01-07 VITALS — BP 124/80 | HR 60 | Temp 98.2°F | Resp 14 | Ht 70.39 in | Wt 170.8 lb

## 2022-01-07 DIAGNOSIS — T148XXA Other injury of unspecified body region, initial encounter: Secondary | ICD-10-CM | POA: Diagnosis not present

## 2022-01-07 DIAGNOSIS — Z0000 Encounter for general adult medical examination without abnormal findings: Secondary | ICD-10-CM | POA: Diagnosis not present

## 2022-01-07 MED ORDER — MUPIROCIN 2 % EX OINT: 1.0000 "application " | TOPICAL_OINTMENT | Freq: Two times a day (BID) | CUTANEOUS | 0 refills | Status: DC

## 2022-01-07 NOTE — Progress Notes (Signed)
Chief Complaint  Patient presents with   Annual Exam    Labs done 01/01/22, denies any pain.   Annual 1. 01/03/22 etoh had 2 [place]nes[place] and coke and did not eat he does not drink that often and does not report alcoholism  2. 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  HENT:  Negative for hearing loss.   Eyes:  Negative for blurred vision.  Respiratory:  Negative for shortness of breath.   Cardiovascular:  Negative for chest pain.  Gastrointestinal:  Negative for abdominal pain and blood in stool.  Musculoskeletal:  Negative for back pain.  Skin:  Negative for rash.  Neurological:  Negative for headaches.  Psychiatric/Behavioral:  Positive for depression. The patient has insomnia.    Past Medical History:  Diagnosis Date   Bronchitis    Chicken pox    COVID-19    02/2021   History of substance abuse ([place]) 09/16/2015   Past Surgical History:  Procedure Laterality Date   CLUB FOOT RELEASE  1980   VASECTOMY     2019   Family History  Problem Relation Age of Onset   Cancer Mother        skin cancer   Hyperlipidemia Father    Heart disease Father        MI x 3   Social History   Socioeconomic History   Marital status: Married    Spouse name: Not on file   Number of children: Not on file   Years of education: Not on file   Highest education level: Not on file  Occupational History   Not on file  Tobacco Use   Smoking status: Never    Passive exposure: Never   Smokeless tobacco: Never  Vaping Use   Vaping Use: Never used  Substance and Sexual Activity   Alcohol use: Yes    Comment: occasionally   Drug use: Not Currently   Sexual activity: Yes    Birth control/protection: Surgical    Comment: Vas 03/23/2018  Other Topics Concern    Not on file  Social History Narrative   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   escitalopram (LEXAPRO) 5 MG tablet Take 1 tablet (5 mg total) by mouth daily.   mupirocin ointment (BACTROBAN) 2 % Apply 1 application  topically 2 (two) times daily.   Allergies  Allergen Reactions   Latex Rash   Nickel Rash   Recent Results (from the past 2160 hour(s))  Comp Met (CMET)     Status: None   Collection Time: 01/01/22  2:47 PM  Result Value Ref Range   Sodium 137 135 - 145  mEq/L   Potassium 4.2 3.5 - 5.1 mEq/L   Chloride 102 96 - 112 mEq/L   CO2 28 19 - 32 mEq/L   Glucose, Bld 90 70 - 99 mg/dL   BUN 11 6 - 23 mg/dL   Creatinine, Ser 0.86 0.40 - 1.50 mg/dL   Total Bilirubin 0.8 0.2 - 1.2 mg/dL   Alkaline Phosphatase 50 39 - 117 U/L   AST 22 0 - 37 U/L   ALT 23 0 - 53 U/L   Total Protein 6.4 6.0 - 8.3 g/dL   Albumin 4.4 3.5 - 5.2 g/dL   GFR 106.78 >60.00 mL/min    Comment: Calculated using the CKD-EPI Creatinine Equation (2021)   Calcium 9.5 8.4 - 10.5 mg/dL  Lipid panel     Status: None   Collection Time: 01/01/22  2:47 PM  Result Value Ref Range   Cholesterol 159 0 - 200 mg/dL    Comment: ATP III Classification       Desirable:  < 200 mg/dL               Borderline High:  200 - 239 mg/dL          High:  > = 240 mg/dL   Triglycerides 56.0 0.0 - 149.0 mg/dL    Comment: Normal:  <150 mg/dLBorderline High:  150 - 199 mg/dL   HDL 70.50 >39.00 mg/dL   VLDL 11.2 0.0 - 40.0 mg/dL   LDL  Cholesterol 78 0 - 99 mg/dL   Total CHOL/HDL Ratio 2     Comment:                Men          Women1/2 Average Risk     3.4          3.3Average Risk          5.0          4.42X Average Risk          9.6          7.13X Average Risk          15.0          11.0                       NonHDL 88.72     Comment: NOTE:  Non-HDL goal should be 30 mg/dL higher than patient's LDL goal (i.e. LDL goal of < 70 mg/dL, would have non-HDL goal of < 100 mg/dL)  CBC w/Diff     Status: None   Collection Time: 01/01/22  2:47 PM  Result Value Ref Range   WBC 5.4 4.0 - 10.5 K/uL   RBC 4.29 4.22 - 5.81 Mil/uL   Hemoglobin 14.4 13.0 - 17.0 g/dL   HCT 41.9 39.0 - 52.0 %   MCV 97.7 78.0 - 100.0 fl   MCHC 34.3 30.0 - 36.0 g/dL   RDW 13.0 11.5 - 15.5 %   Platelets 225.0 150.0 - 400.0 K/uL   Neutrophils Relative % 53.9 43.0 - 77.0 %   Lymphocytes Relative 34.4 12.0 - 46.0 %   Monocytes Relative 8.1 3.0 - 12.0 %   Eosinophils Relative 2.9 0.0 - 5.0 %   Basophils Relative 0.7 0.0 - 3.0 %   Neutro Abs 2.9 1.4 - 7.7 K/uL   Lymphs Abs 1.9 0.7 - 4.0 K/uL   Monocytes Absolute 0.4 0.1 - 1.0 K/uL   Eosinophils Absolute 0.2 0.0 - 0.7  K/uL   Basophils Absolute 0.0 0.0 - 0.1 K/uL  TSH     Status: None   Collection Time: 01/01/22  2:47 PM  Result Value Ref Range   TSH 1.10 0.35 - 5.50 uIU/mL  Vitamin D (25 hydroxy)     Status: None   Collection Time: 01/01/22  2:47 PM  Result Value Ref Range   VITD 36.23 30.00 - 100.00 ng/mL  HgB A1c     Status: None   Collection Time: 01/01/22  2:47 PM  Result Value Ref Range   Hgb A1c MFr Bld 5.0 4.6 - 6.5 %    Comment: Glycemic Control Guidelines for People with Diabetes:Non Diabetic:  <6%Goal of Therapy: <7%Additional Action Suggested:  >8%   Hepatitis C Antibody     Status: None   Collection Time: 01/01/22  2:47 PM  Result Value Ref Range   Hepatitis C Ab NON-REACTIVE NON-REACTIVE   SIGNAL TO CUT-OFF 0.03 <1.00    Comment: . HCV antibody was non-reactive. There is no  laboratory  evidence of HCV infection. . In most cases, no further action is required. However, if recent HCV exposure is suspected, a test for HCV RNA (test code [id number]) is suggested. . For additional information please refer to http://education.questdiagnostics.com/faq/FAQ22v1 (This link is being provided for informational/ educational purposes only.) .   Urinalysis, Routine w reflex microscopic     Status: None   Collection Time: 01/01/22  2:47 PM  Result Value Ref Range   Color, Urine YELLOW Yellow;Lt. Yellow;Straw;Dark Yellow;Amber;Green;Red;Brown   APPearance CLEAR Clear;Turbid;Slightly Cloudy;Cloudy   Specific Gravity, Urine 1.010 1.000 - 1.030   pH 7.0 5.0 - 8.0   Total Protein, Urine NEGATIVE Negative   Urine Glucose NEGATIVE Negative   Ketones, ur NEGATIVE Negative   Bilirubin Urine NEGATIVE Negative   Hgb urine dipstick NEGATIVE Negative   Urobilinogen, UA 0.2 0.0 - 1.0   Leukocytes,Ua NEGATIVE Negative   Nitrite NEGATIVE Negative   WBC, UA 0-2/hpf 0-2/hpf   RBC / HPF 0-2/hpf 0-2/hpf   Squamous Epithelial / LPF Rare(0-4/hpf) Rare(0-4/hpf)  Urine Drug Screen, Qualitative     Status: None   Collection Time: 01/03/22  6:50 PM  Result Value Ref Range   Tricyclic, Ur Screen NONE DETECTED NONE DETECTED   Amphetamines, Ur Screen NONE DETECTED NONE DETECTED   MDMA (Ecstasy)Ur Screen NONE DETECTED NONE DETECTED   Cocaine Metabolite,Ur [place] NONE DETECTED NONE DETECTED   Opiate, Ur Screen NONE DETECTED NONE DETECTED   Phencyclidine (PCP) Ur S NONE DETECTED NONE DETECTED   Cannabinoid 50 Ng, Ur [place] NONE DETECTED NONE DETECTED   Barbiturates, Ur Screen NONE DETECTED NONE DETECTED   Benzodiazepine, Ur Scrn NONE DETECTED NONE DETECTED   Methadone Scn, Ur NONE DETECTED NONE DETECTED    Comment: (NOTE) Tricyclics + metabolites, urine    Cutoff 1000 ng/mL Amphetamines + metabolites, urine  Cutoff 1000 ng/mL MDMA (Ecstasy), urine              Cutoff 500 ng/mL Cocaine Metabolite,  urine          Cutoff 300 ng/mL Opiate + metabolites, urine        Cutoff 300 ng/mL Phencyclidine (PCP), urine         Cutoff 25 ng/mL Cannabinoid, urine                 Cutoff 50 ng/mL Barbiturates + metabolites, urine  Cutoff 200 ng/mL Benzodiazepine, urine  Cutoff 200 ng/mL Methadone, urine                   Cutoff 300 ng/mL  The urine drug screen provides only a preliminary, unconfirmed analytical test result and should not be used for non-medical purposes. Clinical consideration and professional judgment should be applied to any positive drug screen result due to possible interfering substances. A more specific alternate chemical method must be used in order to obtain a confirmed analytical result. Gas chromatography / mass spectrometry (GC/MS) is the preferred confirm atory method. Performed at [facility], [address] [place], [place] [address]    Objective  Body mass index is 24.24 kg/m. Wt Readings from Last 3 Encounters:  01/07/22 170 lb 12.8 oz (77.5 kg)  01/03/22 162 lb (73.5 kg)  09/30/21 175 lb 6.4 oz (79.6 kg)   Temp Readings from Last 3 Encounters:  01/07/22 98.2 F (36.8 C) (Oral)  01/03/22 98.7 F (37.1 C) (Oral)  09/30/21 97.9 F (36.6 C) (Oral)   BP Readings from Last 3 Encounters:  01/07/22 124/80  01/03/22 119/78  09/30/21 112/64   Pulse Readings from Last 3 Encounters:  01/07/22 60  01/03/22 88  09/30/21 72    Physical Exam Vitals and nursing note reviewed.  Constitutional:      Appearance: Normal appearance. He is well-developed and well-groomed.  HENT:     Head: Normocephalic and atraumatic.  Eyes:     Conjunctiva/sclera: Conjunctivae normal.     Pupils: Pupils are equal, round, and reactive to light.  Cardiovascular:     Rate and Rhythm: Normal rate and regular rhythm.     Heart sounds: Normal heart sounds.  Pulmonary:     Effort: Pulmonary effort is normal. No respiratory distress.     Breath sounds:  Normal breath sounds.  Abdominal:     Tenderness: There is no abdominal tenderness.  Skin:    General: Skin is warm and moist.     Comments: Abrasions and wounds to nose bridge, upper lip, b/l thighs Right forearm/hand   Neurological:     General: No focal deficit present.     Mental Status: He is alert and oriented to person, place, and time. Mental status is at baseline.     Sensory: Sensation is intact.     Motor: Motor function is intact.     Coordination: Coordination is intact.     Gait: Gait is intact. Gait normal.  Psychiatric:        Attention and Perception: Attention and perception normal.        Mood and Affect: Mood and affect normal.        Speech: Speech normal.        Behavior: Behavior normal. Behavior is cooperative.        Thought Content: Thought content normal.        Cognition and Memory: Cognition and memory normal.        Judgment: Judgment normal.     Assessment  Plan  Annual physical exam See below  Open wound - Plan: mupirocin ointment (BACTROBAN) 2 %  For Face wounds [employer]  Organic mama scar balm  ORGANIC SKIN & SCAR BALM Regular price$24.99  Silagen  Silagen 962% Pure Silicone Gel + SPF 30 95M  HM Flu shot declines Tdap utd 2017 Declines covid 19  Hep C negative  Psa 01/01/21 0.61  Colonoscopy age 8  Dentist Ruthy Dick >Dr. Posey Pronto in [place] on Abx as of 12/2021 dental  implants had dental implants front teeth on amoxicillin  [facility] Dr. Eula Flax  PSA 01/01/21 0.61  Consider check testosterone in the future with fatigue Rec healthy diet and exercise  Provider: Dr. Olivia Mackie McLean-Scocuzza-Internal Medicine

## 2022-01-07 NOTE — Patient Instructions (Addendum)
Use bactroban to open wounds 2x per day  Use anitbacterial soap all wounds Dove antibacterial body wash, chlorhexadine/dial   For Face [employer]  Organic mama scar balm  ORGANIC SKIN & SCAR BALM Regular price$24.99  Silagen  Silagen 100% Pure Silicone Gel + SPF 30 15g   Men's multivitamin (centrum or nature)  Water 55-64 ounces daily  B12 1000 mg daily    Think about this for mood and sleep &let me know  Trazodone Tablets What is this medication? TRAZODONE (TRAZ oh done) treats depression. It increases the amount of serotonin in the brain, a hormone that helps regulate mood. This medicine may be used for other purposes; ask your health care provider or pharmacist if you have questions. COMMON BRAND NAME(S): Desyrel What should I tell my care team before I take this medication? They need to know if you have any of these conditions: Attempted suicide or thinking about it Bipolar disorder Bleeding problems Glaucoma Heart disease, or previous heart attack Irregular heart beat Kidney or liver disease Low levels of sodium in the blood An unusual or allergic reaction to trazodone, other medications, foods, dyes or preservatives Pregnant or trying to get pregnant Breast-feeding How should I use this medication? Take this medication by mouth with a glass of water. Follow the directions on the prescription label. Take this medication shortly after a meal or a light snack. Take your medication at regular intervals. Do not take your medication more often than directed. Do not stop taking this medication suddenly except upon the advice of your care team. Stopping this medication too quickly may cause serious side effects or your condition may worsen. A special MedGuide will be given to you by the pharmacist with each prescription and refill. Be sure to read this information carefully each time. Talk to your care team regarding the use of this medication in children. Special care may be  needed. Overdosage: If you think you have taken too much of this medicine contact a poison control center or emergency room at once. NOTE: This medicine is only for you. Do not share this medicine with others. What if I miss a dose? If you miss a dose, take it as soon as you can. If it is almost time for your next dose, take only that dose. Do not take double or extra doses. What may interact with this medication? Do not take this medication with any of the following: Certain medications for fungal infections like fluconazole, itraconazole, ketoconazole, posaconazole, voriconazole Cisapride Dronedarone Linezolid MAOIs like Carbex, Eldepryl, Marplan, Nardil, and Parnate Mesoridazine Methylene blue (injected into a vein) Pimozide Saquinavir Thioridazine This medication may also interact with the following: Alcohol Antiviral medications for HIV or AIDS Aspirin and aspirin-like medications Barbiturates like phenobarbital Certain medications for blood pressure, heart disease, irregular heart beat Certain medications for depression, anxiety, or psychotic disturbances Certain medications for migraine headache like almotriptan, eletriptan, frovatriptan, naratriptan, rizatriptan, sumatriptan, zolmitriptan Certain medications for seizures like carbamazepine and phenytoin Certain medications for sleep Certain medications that treat or prevent blood clots like dalteparin, enoxaparin, warfarin Digoxin Fentanyl Lithium NSAIDS, medications for pain and inflammation, like ibuprofen or naproxen Other medications that prolong the QT interval (cause an abnormal heart rhythm) like dofetilide Rasagiline Supplements like St. John's wort, kava kava, valerian Tramadol Tryptophan This list may not describe all possible interactions. Give your health care provider a list of all the medicines, herbs, non-prescription drugs, or dietary supplements you use. Also tell them if you smoke, drink alcohol, or  use  illegal drugs. Some items may interact with your medicine. What should I watch for while using this medication? Tell your care team if your symptoms do not get better or if they get worse. Visit your care team for regular checks on your progress. Because it may take several weeks to see the full effects of this medication, it is important to continue your treatment as prescribed by your care team. Watch for new or worsening thoughts of suicide or depression. This includes sudden changes in mood, behaviors, or thoughts. These changes can happen at any time but are more common in the beginning of treatment or after a change in dose. Call your care team right away if you experience these thoughts or worsening depression. Manic episodes may happen in patients with bipolar disorder who take this medication. Watch for changes in feelings or behaviors such as feeling anxious, nervous, agitated, panicky, irritable, hostile, aggressive, impulsive, severely restless, overly excited and hyperactive, or trouble sleeping. These changes can happen at any time but are more common in the beginning of treatment or after a change in dose. Call your care team right away if you notice any of these symptoms. You may get drowsy or dizzy. Do not drive, use machinery, or do anything that needs mental alertness until you know how this medication affects you. Do not stand or sit up quickly, especially if you are an older patient. This reduces the risk of dizzy or fainting spells. Alcohol may interfere with the effect of this medication. Avoid alcoholic drinks. This medication may cause dry eyes and blurred vision. If you wear contact lenses you may feel some discomfort. Lubricating drops may help. See your eye doctor if the problem does not go away or is severe. Your mouth may get dry. Chewing sugarless gum, sucking hard candy and drinking plenty of water may help. Contact your care team if the problem does not go away or is  severe. What side effects may I notice from receiving this medication? Side effects that you should report to your care team as soon as possible: Allergic reactions--skin rash, itching, hives, swelling of the face, lips, tongue, or throat Bleeding--bloody or black, tar-like stools, red or dark brown urine, vomiting blood or brown material that looks like coffee grounds, small, red or purple spots on skin, unusual bleeding or bruising Heart rhythm changes--fast or irregular heartbeat, dizziness, feeling faint or lightheaded, chest pain, trouble breathing Low blood pressure--dizziness, feeling faint or lightheaded, blurry vision Low sodium level--muscle weakness, fatigue, dizziness, headache, confusion Prolonged or painful erection Serotonin syndrome--irritability, confusion, fast or irregular heartbeat, muscle stiffness, twitching muscles, sweating, high fever, seizures, chills, vomiting, diarrhea Sudden eye pain or change in vision such as blurry vision, seeing halos around lights, vision loss Thoughts of suicide or self-harm, worsening mood, feelings of depression Side effects that usually do not require medical attention (report to your care team if they continue or are bothersome): Change in sex drive or performance Constipation Dizziness Drowsiness Dry mouth This list may not describe all possible side effects. Call your doctor for medical advice about side effects. You may report side effects to FDA at 1-800-FDA-1088. Where should I keep my medication? Keep out of the reach of children and pets. Store at room temperature between 15 and 30 degrees C (59 to 86 degrees F). Protect from light. Keep container tightly closed. Throw away any unused medication after the expiration date. NOTE: This sheet is a summary. It may not cover all possible information.  If you have questions about this medicine, talk to your doctor, pharmacist, or health care provider.  2023 Elsevier/Gold Standard  (2020-07-03 00:00:00)

## 2022-02-03 ENCOUNTER — Encounter: Payer: Self-pay | Admitting: Internal Medicine

## 2022-02-03 MED ORDER — TRAZODONE HCL 50 MG PO TABS: 50.0000 mg | ORAL_TABLET | Freq: Every evening | ORAL | 5 refills | Status: DC | PRN

## 2022-02-03 NOTE — Addendum Note (Signed)
Addended by: Quentin Ore on: 02/03/2022 05:15 PM   Modules accepted: Orders

## 2022-02-27 ENCOUNTER — Ambulatory Visit: Payer: Self-pay

## 2022-02-27 ENCOUNTER — Telehealth: Payer: BC Managed Care – PPO | Admitting: Physician Assistant

## 2022-02-27 DIAGNOSIS — B9689 Other specified bacterial agents as the cause of diseases classified elsewhere: Secondary | ICD-10-CM

## 2022-02-27 DIAGNOSIS — J028 Acute pharyngitis due to other specified organisms: Secondary | ICD-10-CM | POA: Diagnosis not present

## 2022-02-27 MED ORDER — AMOXICILLIN 500 MG PO CAPS: 500.0000 mg | ORAL_CAPSULE | Freq: Two times a day (BID) | ORAL | 0 refills | Status: AC

## 2022-02-27 NOTE — Progress Notes (Signed)

## 2022-02-28 ENCOUNTER — Other Ambulatory Visit: Payer: Self-pay | Admitting: Internal Medicine

## 2022-02-28 DIAGNOSIS — F5104 Psychophysiologic insomnia: Secondary | ICD-10-CM

## 2022-02-28 DIAGNOSIS — F32A Depression, unspecified: Secondary | ICD-10-CM

## 2022-06-14 ENCOUNTER — Telehealth: Payer: BC Managed Care – PPO | Admitting: Family

## 2022-06-14 DIAGNOSIS — Z20818 Contact with and (suspected) exposure to other bacterial communicable diseases: Secondary | ICD-10-CM

## 2022-06-14 DIAGNOSIS — J029 Acute pharyngitis, unspecified: Secondary | ICD-10-CM

## 2022-06-14 MED ORDER — AMOXICILLIN 500 MG PO CAPS
500.0000 mg | ORAL_CAPSULE | Freq: Two times a day (BID) | ORAL | 0 refills | Status: AC
Start: 2022-06-14 — End: 2022-06-24

## 2022-06-14 NOTE — Progress Notes (Signed)

## 2022-09-04 IMAGING — CT CT HEAD W/O CM
4 series · 16 of 47 positions shown, 18 images · non-contrast
Comparison: None Available.

CLINICAL DATA: Status post fall.



[Series 1: head bone · axial · 0.46mm/px · z∈[-97,-65]mm · 3 of 78 slices shown]
[im 8/78  bone]
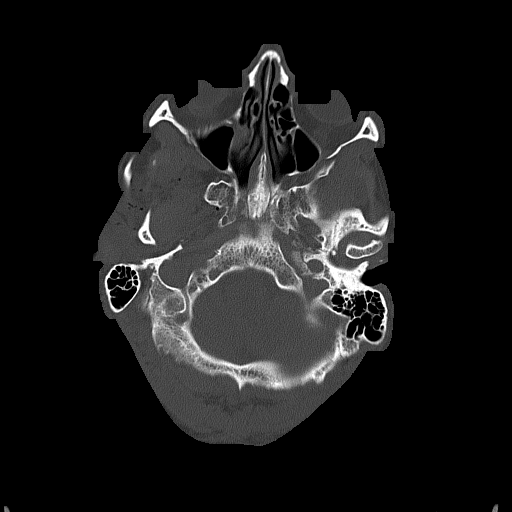
[im 16/78  bone]
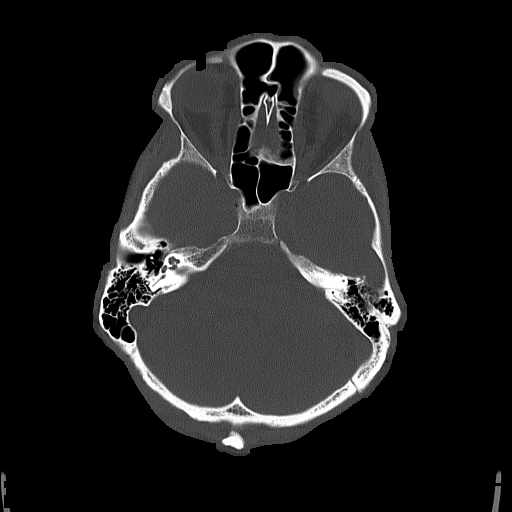
[im 24/78  bone]
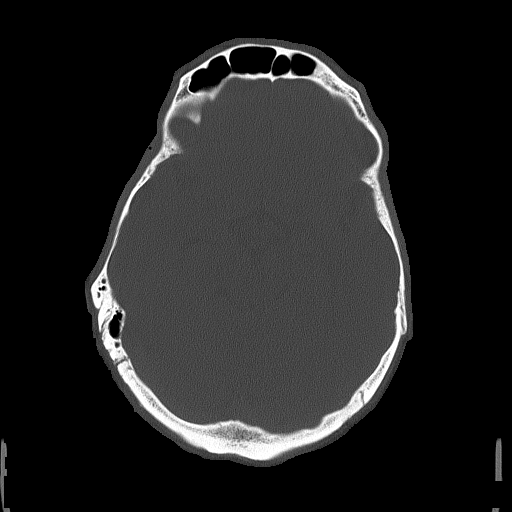

[Series 3: coronal soft tissue · coronal · 0.34mm/px · 3 of 66 slices shown]
[im 22/66  brain]
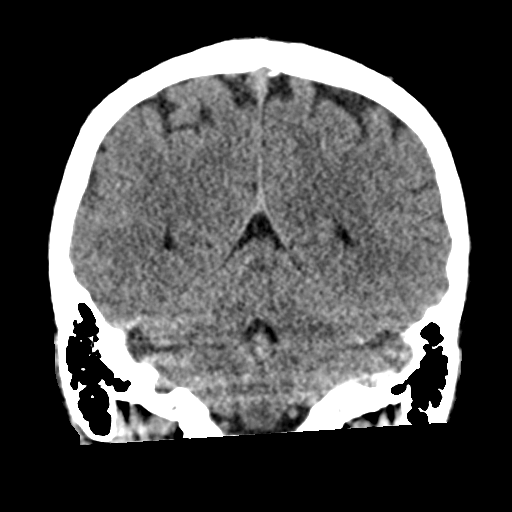
[im 29/66  brain]
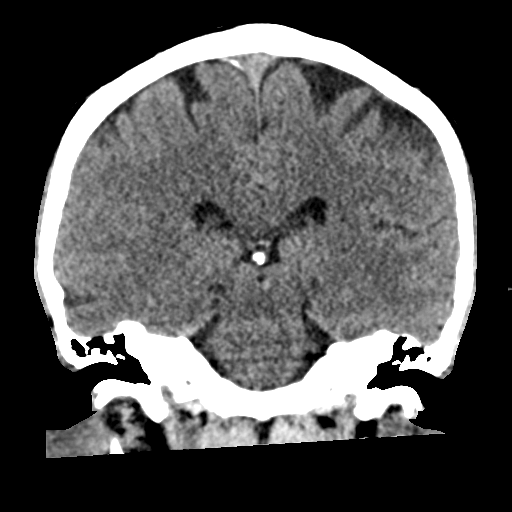
[im 37/66  brain]
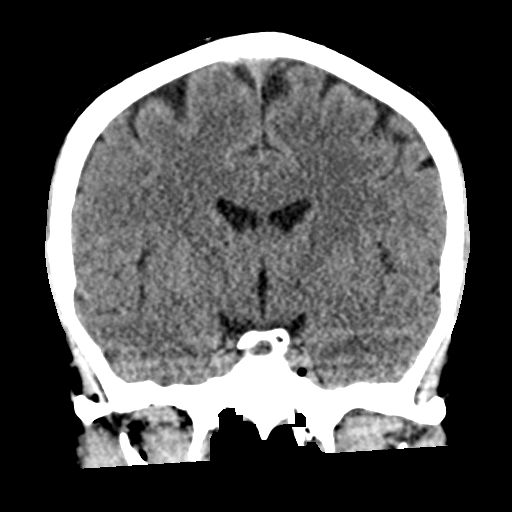

[Series 4: sagittal soft tissue · sagittal · 0.34mm/px · 3 of 53 slices shown]
[im 18/53  brain]
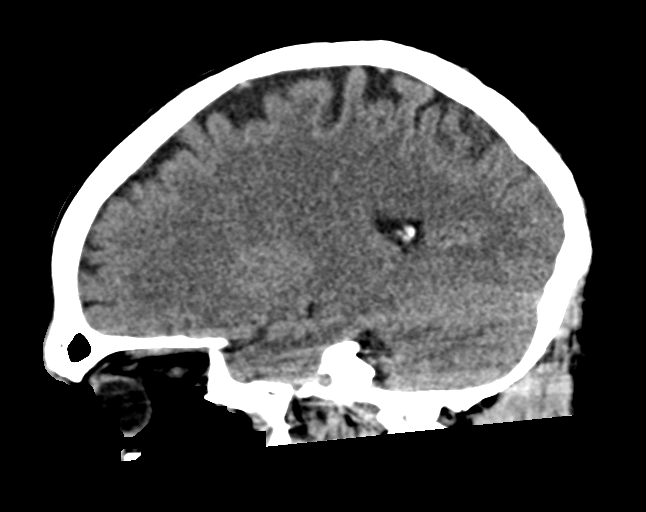
[im 27/53  brain]
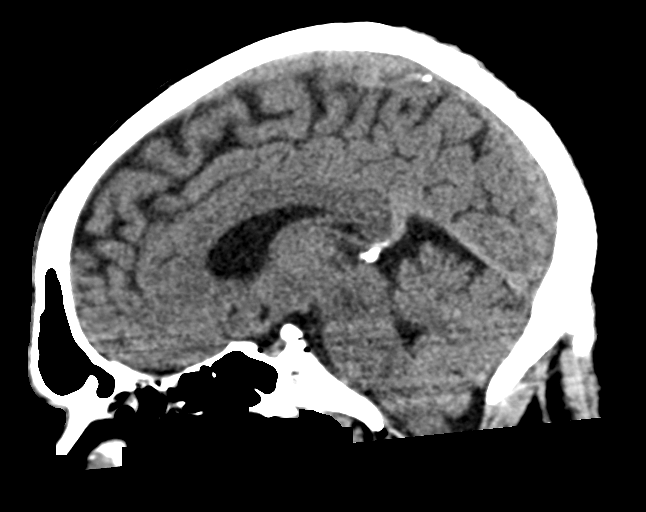
[im 35/53  brain]
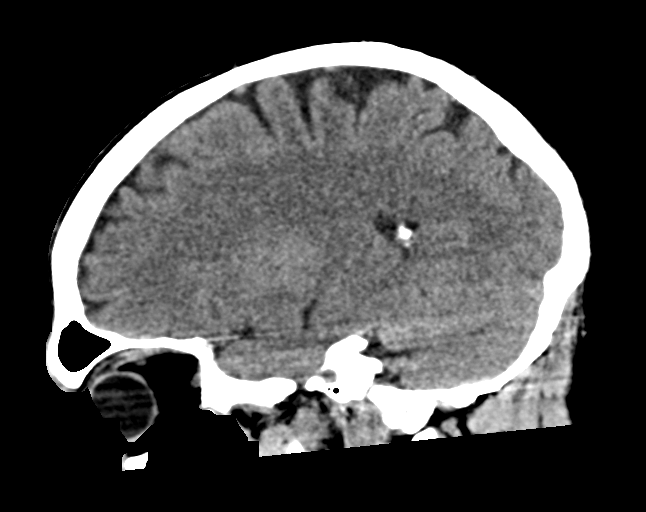

[Series 6: head wo · axial · 0.46mm/px · z∈[-96,+24]mm · 7 of 32 slices shown, 9 images]
[im 4/32  brain]
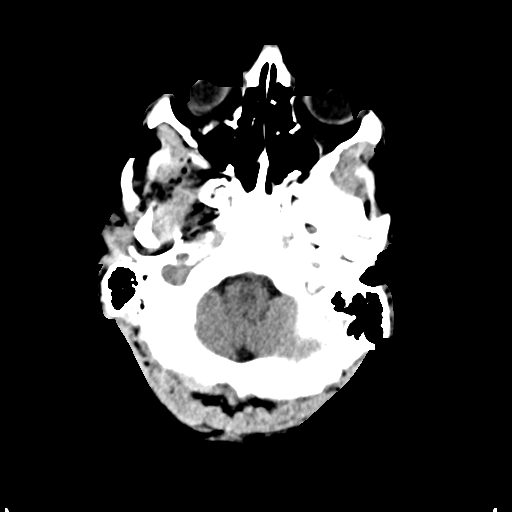
[im 4/32  bone]
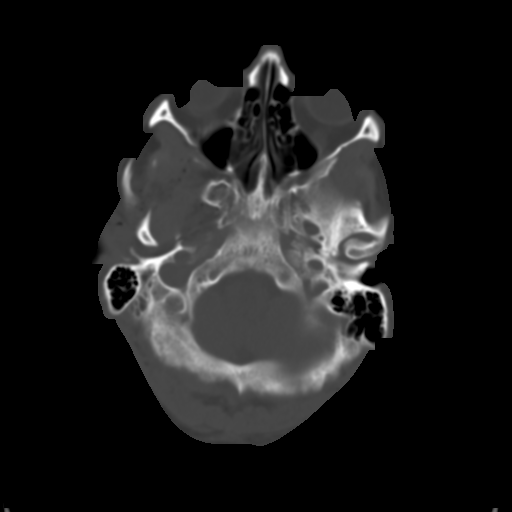
[im 8/32  brain]
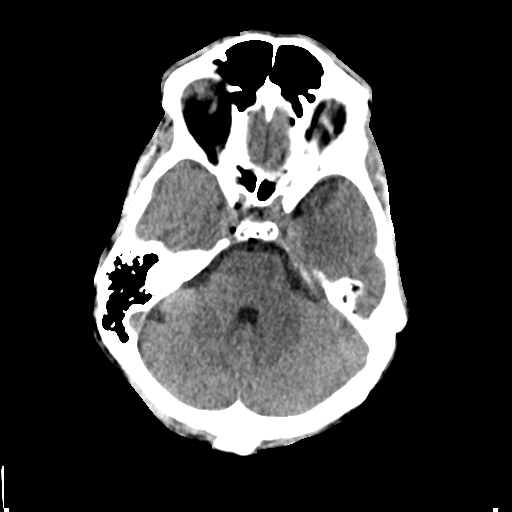
[im 12/32  brain]
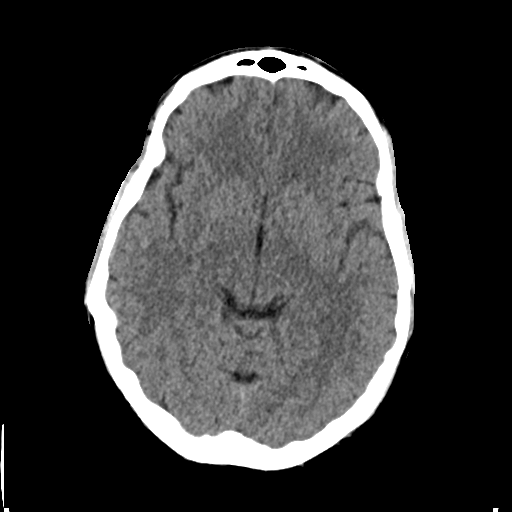
[im 16/32  brain]
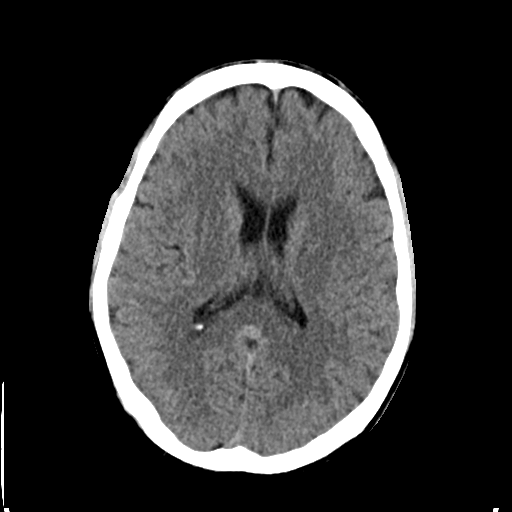
[im 20/32  brain]
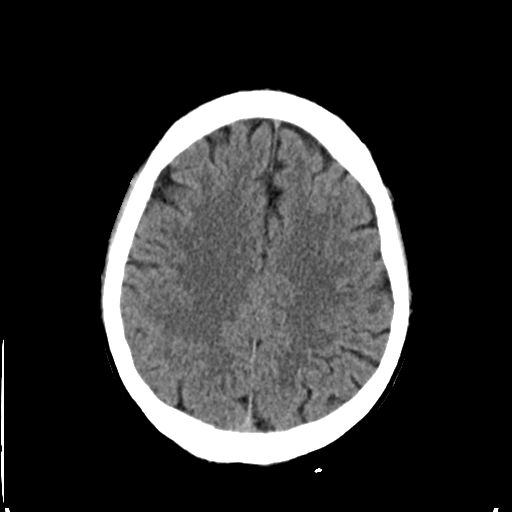
[im 20/32  bone]
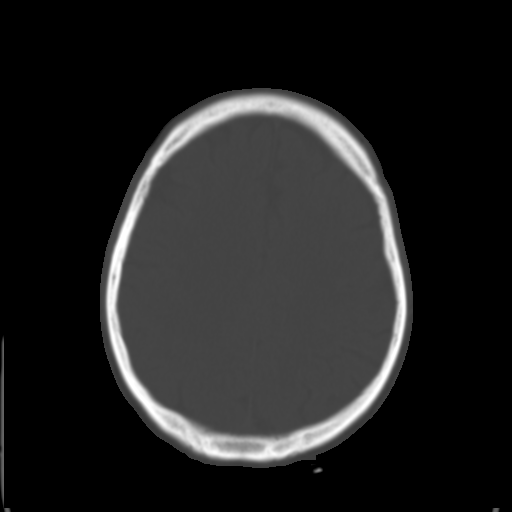
[im 24/32  brain]
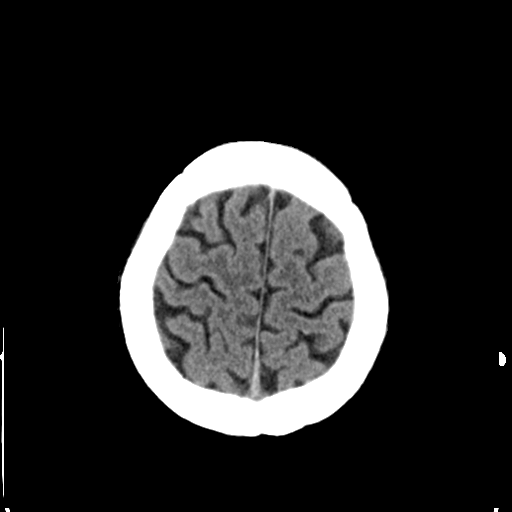
[im 28/32  brain]
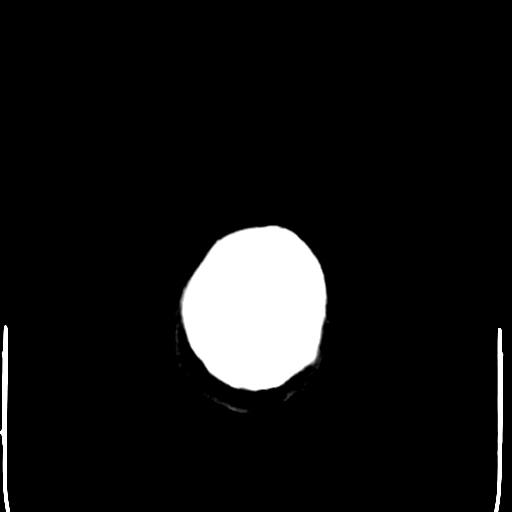

[16 of 47 positions shown; findings below may reference images not displayed]

FINDINGS: Brain: No evidence of acute infarction, hemorrhage, hydrocephalus,
extra-axial collection or mass lesion/mass effect.

Vascular: No hyperdense vessel or unexpected calcification.

Skull: Normal. Negative for fracture or focal lesion.

Sinuses/Orbits: A 1.2 cm right maxillary sinus polyp versus mucous
retention cyst is seen.

Other: A small amount of right frontotemporal scalp soft tissue
swelling is seen.
IMPRESSION: 1. No acute intracranial abnormality.
2. Small amount of right frontotemporal scalp soft tissue swelling.

## 2022-10-09 ENCOUNTER — Telehealth: Payer: BC Managed Care – PPO | Admitting: Family Medicine

## 2022-10-09 DIAGNOSIS — B029 Zoster without complications: Secondary | ICD-10-CM

## 2022-10-09 MED ORDER — VALACYCLOVIR HCL 1 G PO TABS: 1000.0000 mg | ORAL_TABLET | Freq: Three times a day (TID) | ORAL | 0 refills | Status: AC

## 2022-10-09 MED ORDER — GABAPENTIN 300 MG PO CAPS: 300.0000 mg | ORAL_CAPSULE | Freq: Two times a day (BID) | ORAL | 0 refills | Status: DC

## 2022-10-09 NOTE — Progress Notes (Signed)
E-visit for Shingles   We are sorry that you are not feeling well. Here is how we plan to help!  Based on what you shared with me it looks like you have shingles.  Shingles or herpes zoster, is a common infection of the nerves.  It is a painful rash caused by the herpes zoster virus.  This is the same virus that causes chickenpox.  After a person has chickenpox, the virus remains inactive in the nerve cells.  Years later, the virus can become active again and travel to the skin.  It typically will appear on one side of the face or body.  Burning or shooting pain, tingling, or itching are early signs of the infection.  Blisters typically scab over in 7 to 10 days and clear up within 2-4 weeks. Shingles is only contagious to people that have never had the chickenpox, the chickenpox vaccine, or anyone who has a compromised immune system.  You should avoid contact with these type of people until your blisters scab over.  I have prescribed Valacyclovir 1g three times daily for 7 days and also Gabapentin 300mg twice daily as needed for pain   HOME CARE: . Apply ice packs (wrapped in a thin towel), cool compresses, or soak in cool bath to help reduce pain. . Use calamine lotion to calm itchy skin. . Avoid scratching the rash. . Avoid direct sunlight.  GET HELP RIGHT AWAY IF: . Symptoms that don't away after treatment. . A rash or blisters near your eye. . Increased drainage, fever, or rash after treatment. . Severe pain that doesn't go away.   MAKE SURE YOU    Understand these instructions.  Will watch your condition.  Will get help right away if you are not doing well or get worse.  Thank you for choosing an e-visit. Your e-visit answers were reviewed by a board certified advanced clinical practitioner to complete your personal care plan. Depending upon the condition, your plan could have included both over the counter or prescription medications.  Please review your pharmacy choice.  Make sure the pharmacy is open so you can pick up prescription now. If there is a problem, you may contact your provider through MyChart messaging and have the prescription routed to another pharmacy.  Your safety is important to us. If you have drug allergies check your prescription carefully.   For the next 24 hours you can use MyChart to ask questions about today's visit, request a non-urgent call back, or ask for a work or school excuse.  You will get an email in the next two days asking about your experience. I hope that your e-visit has been valuable and will speed your recovery  

## 2022-10-16 NOTE — Progress Notes (Signed)
I have provided 5 minutes of non face to face time during this encounter for chart review and documentation.   

## 2023-07-08 ENCOUNTER — Encounter: Payer: Self-pay | Admitting: Internal Medicine

## 2023-07-08 ENCOUNTER — Ambulatory Visit: Payer: BC Managed Care – PPO | Admitting: Internal Medicine

## 2023-07-08 VITALS — BP 112/74 | Ht 70.0 in | Wt 176.8 lb

## 2023-07-08 DIAGNOSIS — Z136 Encounter for screening for cardiovascular disorders: Secondary | ICD-10-CM

## 2023-07-08 DIAGNOSIS — R739 Hyperglycemia, unspecified: Secondary | ICD-10-CM | POA: Diagnosis not present

## 2023-07-08 DIAGNOSIS — E663 Overweight: Secondary | ICD-10-CM | POA: Diagnosis not present

## 2023-07-08 DIAGNOSIS — Z6825 Body mass index (BMI) 25.0-25.9, adult: Secondary | ICD-10-CM

## 2023-07-08 DIAGNOSIS — Z0001 Encounter for general adult medical examination with abnormal findings: Secondary | ICD-10-CM

## 2023-07-08 NOTE — Assessment & Plan Note (Signed)
Encouraged diet and exercise for weight loss ?

## 2023-07-08 NOTE — Progress Notes (Signed)
Subjective:    Patient ID: Tim Ramirez, male    DOB: 12/02/1980, 42 y.o.   MRN: [id number]  HPI  Pt presents to the clinic today to establish care. He would like his annual exam today.   Flu: never Tetanus: 08/2015 Covid: never Vision screening: as needed Dentist: biannually  Diet: He does eat meat. He consumes some fruits and veggies. He does eat fried foods. He drinks mostly water, gatorade, soda and sweet tea. Exercise: Walking  Review of Systems   Past Medical History:  Diagnosis Date   Bronchitis    Chicken pox    COVID-19    02/2021   History of substance abuse (HCC) 09/16/2015    Current Outpatient Medications  Medication Sig Dispense Refill   albuterol (VENTOLIN HFA) 108 (90 Base) MCG/ACT inhaler Inhale 2 puffs into the lungs every 2 (two) hours as needed for wheezing or shortness of breath (cough). 18 g 0   escitalopram (LEXAPRO) 5 MG tablet Take 1 tablet (5 mg total) by mouth daily. 90 tablet 3   gabapentin (NEURONTIN) 300 MG capsule Take 1 capsule (300 mg total) by mouth 2 (two) times daily for 10 days. PRN pain 20 capsule 0   mupirocin ointment (BACTROBAN) 2 % Apply 1 application  topically 2 (two) times daily. 30 g 0   traZODone (DESYREL) 50 MG tablet TAKE 1-2 TABLETS BY MOUTH AT BEDTIME AS NEEDED FOR SLEEP. 180 tablet 2   No current facility-administered medications for this visit.    Allergies  Allergen Reactions   Latex Rash   Nickel Rash    Family History  Problem Relation Age of Onset   Cancer Mother        skin cancer   Hyperlipidemia Father    Heart disease Father        MI x 3    Social History   Socioeconomic History   Marital status: Married    Spouse name: Not on file   Number of children: Not on file   Years of education: Not on file   Highest education level: Not on file  Occupational History   Not on file  Tobacco Use   Smoking status: Never    Passive exposure: Never   Smokeless tobacco: Never  Vaping Use    Vaping status: Never Used  Substance and Sexual Activity   Alcohol use: Yes    Comment: occasionally   Drug use: Not Currently   Sexual activity: Yes    Birth control/protection: Surgical    Comment: Vas 03/23/2018  Other Topics Concern   Not on file  Social History Narrative   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  HEENT: Denies eye pain, eye redness, ear pain, ringing in the ears, wax buildup, runny nose, nasal congestion, bloody nose, or sore throat. Respiratory: Denies difficulty breathing, shortness of breath, cough or sputum production.   Cardiovascular: Denies chest pain, chest tightness, palpitations or swelling in the hands or feet.  Gastrointestinal: Pt reports intermittent reflux. Denies abdominal pain, bloating, constipation, diarrhea or  blood in the stool.  GU: Denies urgency, frequency, pain with urination, burning sensation, blood in urine, odor or discharge. Musculoskeletal: Patient reports intermittent joint pain.  Denies decrease in range of motion, difficulty with gait, muscle pain or joint swelling.  Skin: Denies redness, rashes, lesions or ulcercations.  Neurological: Denies dizziness, difficulty with memory, difficulty with speech or problems with balance and coordination.  Psych: Denies anxiety, depression, SI/HI.  No other specific complaints in a complete review of systems (except as listed in HPI above).      Objective:   Physical Exam  BP 112/74 (BP Location: Left Arm, Patient Position: Sitting, Cuff Size: Normal)   Ht 5\' 10"  (1.778 m)   Wt 176 lb 12.8 oz (80.2 kg)   BMI  25.37 kg/m   Wt Readings from Last 3 Encounters:  01/07/22 170 lb 12.8 oz (77.5 kg)  01/03/22 162 lb (73.5 kg)  09/30/21 175 lb 6.4 oz (79.6 kg)    General: Appears his stated age, overweight, in NAD. Skin: Warm, dry and intact. HEENT: Head: normal shape and size; Eyes: sclera white, no icterus, conjunctiva pink, PERRLA and EOMs intact;  Neck:  Neck supple, trachea midline. No masses, lumps or thyromegaly present.  Cardiovascular: Normal rate and rhythm. S1,S2 noted.  No murmur, rubs or gallops noted. No JVD or BLE edema.  Pulmonary/Chest: Normal effort and positive vesicular breath sounds. No respiratory distress. No wheezes, rales or ronchi noted.  Abdomen: Soft and nontender. Normal bowel sounds. Musculoskeletal: Strength 5/5 BUE/BLE.Marland Kitchen No difficulty with gait.  Neurological: Alert and oriented. Cranial nerves II-XII grossly intact. Coordination normal.  Psychiatric: Mood and affect normal. Behavior is normal. Judgment and thought content normal.    BMET    Component Value Date/Time   NA 137 01/01/2022 1447   K 4.2 01/01/2022 1447   CL 102 01/01/2022 1447   CO2 28 01/01/2022 1447   GLUCOSE 90 01/01/2022 1447   BUN 11 01/01/2022 1447   CREATININE 0.86 01/01/2022 1447   CALCIUM 9.5 01/01/2022 1447    Lipid Panel     Component Value Date/Time   CHOL 159 01/01/2022 1447   TRIG 56.0 01/01/2022 1447   HDL 70.50 01/01/2022 1447   CHOLHDL 2 01/01/2022 1447   VLDL 11.2 01/01/2022 1447   LDLCALC 78 01/01/2022 1447    CBC    Component Value Date/Time   WBC 5.4 01/01/2022 1447   RBC 4.29 01/01/2022 1447   HGB 14.4 01/01/2022 1447   HCT 41.9 01/01/2022 1447   PLT 225.0 01/01/2022 1447   MCV 97.7 01/01/2022 1447   MCHC 34.3 01/01/2022 1447   RDW 13.0 01/01/2022 1447   LYMPHSABS 1.9 01/01/2022 1447   MONOABS 0.4 01/01/2022 1447   EOSABS 0.2 01/01/2022 1447   BASOSABS 0.0 01/01/2022 1447    Hgb A1C Lab Results  Component Value Date   HGBA1C 5.0 01/01/2022             Assessment & Plan:  Preventative health maintenance:  Flu shot declined Tetanus UTD Encouraged him to get his COVID-vaccine Encouraged him to consume a balanced diet and exercise regimen Advised him to see an eye doctor and dentist annually Will check CBC, c-Met, lipid, A1c today   RTC in 1 year for your annual exam Nicki Reaper, NP

## 2023-07-08 NOTE — Patient Instructions (Signed)
Health Maintenance, Male Adopting a healthy lifestyle and getting preventive care are important in promoting health and wellness. Ask your health care provider about: The right schedule for you to have regular tests and exams. Things you can do on your own to prevent diseases and keep yourself healthy. What should I know about diet, weight, and exercise? Eat a healthy diet  Eat a diet that includes plenty of vegetables, fruits, low-fat dairy products, and lean protein. Do not eat a lot of foods that are high in solid fats, added sugars, or sodium. Maintain a healthy weight Body mass index (BMI) is a measurement that can be used to identify possible weight problems. It estimates body fat based on height and weight. Your health care provider can help determine your BMI and help you achieve or maintain a healthy weight. Get regular exercise Get regular exercise. This is one of the most important things you can do for your health. Most adults should: Exercise for at least 150 minutes each week. The exercise should increase your heart rate and make you sweat (moderate-intensity exercise). Do strengthening exercises at least twice a week. This is in addition to the moderate-intensity exercise. Spend less time sitting. Even light physical activity can be beneficial. Watch cholesterol and blood lipids Have your blood tested for lipids and cholesterol at 44 years of age, then have this test every 5 years. You may need to have your cholesterol levels checked more often if: Your lipid or cholesterol levels are high. You are older than 44 years of age. You are at high risk for heart disease. What should I know about cancer screening? Many types of cancers can be detected early and may often be prevented. Depending on your health history and family history, you may need to have cancer screening at various ages. This may include screening for: Colorectal cancer. Prostate cancer. Skin cancer. Lung  cancer. What should I know about heart disease, diabetes, and high blood pressure? Blood pressure and heart disease High blood pressure causes heart disease and increases the risk of stroke. This is more likely to develop in people who have high blood pressure readings or are overweight. Talk with your health care provider about your target blood pressure readings. Have your blood pressure checked: Every 3-5 years if you are 18-39 years of age. Every year if you are 40 years old or older. If you are between the ages of 65 and 75 and are a current or former smoker, ask your health care provider if you should have a one-time screening for abdominal aortic aneurysm (AAA). Diabetes Have regular diabetes screenings. This checks your fasting blood sugar level. Have the screening done: Once every three years after age 45 if you are at a normal weight and have a low risk for diabetes. More often and at a younger age if you are overweight or have a high risk for diabetes. What should I know about preventing infection? Hepatitis B If you have a higher risk for hepatitis B, you should be screened for this virus. Talk with your health care provider to find out if you are at risk for hepatitis B infection. Hepatitis C Blood testing is recommended for: Everyone born from 1945 through 1965. Anyone with known risk factors for hepatitis C. Sexually transmitted infections (STIs) You should be screened each year for STIs, including gonorrhea and chlamydia, if: You are sexually active and are younger than 44 years of age. You are older than 44 years of age and your   health care provider tells you that you are at risk for this type of infection. Your sexual activity has changed since you were last screened, and you are at increased risk for chlamydia or gonorrhea. Ask your health care provider if you are at risk. Ask your health care provider about whether you are at high risk for HIV. Your health care provider  may recommend a prescription medicine to help prevent HIV infection. If you choose to take medicine to prevent HIV, you should first get tested for HIV. You should then be tested every 3 months for as long as you are taking the medicine. Follow these instructions at home: Alcohol use Do not drink alcohol if your health care provider tells you not to drink. If you drink alcohol: Limit how much you have to 0-2 drinks a day. Know how much alcohol is in your drink. In the [place] one drink equals one 12 oz bottle of beer (355 mL), one 5 oz glass of wine (148 mL), or one 1 oz glass of hard liquor (44 mL). Lifestyle Do not use any products that contain nicotine or tobacco. These products include cigarettes, chewing tobacco, and vaping devices, such as e-cigarettes. If you need help quitting, ask your health care provider. Do not use street drugs. Do not share needles. Ask your health care provider for help if you need support or information about quitting drugs. General instructions Schedule regular health, dental, and eye exams. Stay current with your vaccines. Tell your health care provider if: You often feel depressed. You have ever been abused or do not feel safe at home. Summary Adopting a healthy lifestyle and getting preventive care are important in promoting health and wellness. Follow your health care provider's instructions about healthy diet, exercising, and getting tested or screened for diseases. Follow your health care provider's instructions on monitoring your cholesterol and blood pressure. This information is not intended to replace advice given to you by your health care provider. Make sure you discuss any questions you have with your health care provider. Document Revised: 12/02/2020 Document Reviewed: 12/02/2020 Elsevier Patient Education  2024 [employer]  

## 2023-07-09 LAB — COMPLETE METABOLIC PANEL WITH GFR
AG Ratio: 2.1 (calc) (ref 1.0–2.5)
ALT: 27 U/L (ref 9–46)
AST: 23 U/L (ref 10–40)
Albumin: 4.4 g/dL (ref 3.6–5.1)
Alkaline phosphatase (APISO): 51 U/L (ref 36–130)
BUN: 13 mg/dL (ref 7–25)
CO2: 29 mmol/L (ref 20–32)
Calcium: 9.6 mg/dL (ref 8.6–10.3)
Chloride: 104 mmol/L (ref 98–110)
Creat: 0.9 mg/dL (ref 0.60–1.29)
Globulin: 2.1 g/dL (ref 1.9–3.7)
Glucose, Bld: 108 mg/dL — ABNORMAL HIGH (ref 65–99)
Potassium: 4.4 mmol/L (ref 3.5–5.3)
Sodium: 140 mmol/L (ref 135–146)
Total Bilirubin: 0.5 mg/dL (ref 0.2–1.2)
Total Protein: 6.5 g/dL (ref 6.1–8.1)
eGFR: 108 mL/min/{1.73_m2} (ref >=60)

## 2023-07-09 LAB — LIPID PANEL
Cholesterol: 164 mg/dL (ref <200)
HDL: 77 mg/dL (ref >=40)
LDL Cholesterol (Calc): 73 mg/dL
Non-HDL Cholesterol (Calc): 87 mg/dL (ref <130)
Total CHOL/HDL Ratio: 2.1 (calc) (ref <5.0)
Triglycerides: 49 mg/dL (ref <150)

## 2023-07-09 LAB — CBC
HCT: 43 % (ref 38.5–50.0)
Hemoglobin: 14.8 g/dL (ref 13.2–17.1)
MCH: 33.3 pg — ABNORMAL HIGH (ref 27.0–33.0)
MCHC: 34.4 g/dL (ref 32.0–36.0)
MCV: 96.6 fL (ref 80.0–100.0)
MPV: 10.8 fL (ref 7.5–12.5)
Platelets: 235 10*3/uL (ref 140–400)
RBC: 4.45 10*6/uL (ref 4.20–5.80)
RDW: 12.2 % (ref 11.0–15.0)
WBC: 4.9 10*3/uL (ref 3.8–10.8)

## 2023-07-09 LAB — HEMOGLOBIN A1C
Hgb A1c MFr Bld: 5.1 %{Hb} (ref <5.7)
Mean Plasma Glucose: 100 mg/dL
eAG (mmol/L): 5.5 mmol/L

## 2024-04-17 ENCOUNTER — Ambulatory Visit: Payer: Self-pay

## 2024-04-17 ENCOUNTER — Ambulatory Visit

## 2024-04-17 VITALS — BP 142/94 | HR 68 | Temp 98.5°F | Wt 176.0 lb

## 2024-04-17 DIAGNOSIS — M5416 Radiculopathy, lumbar region: Secondary | ICD-10-CM | POA: Diagnosis not present

## 2024-04-17 DIAGNOSIS — Z0279 Encounter for issue of other medical certificate: Secondary | ICD-10-CM

## 2024-04-17 MED ORDER — MELOXICAM 7.5 MG PO TABS: 7.5000 mg | ORAL_TABLET | Freq: Every day | ORAL | 0 refills | Status: DC

## 2024-04-17 MED ORDER — METHOCARBAMOL 500 MG PO TABS: 500.0000 mg | ORAL_TABLET | Freq: Three times a day (TID) | ORAL | 0 refills | Status: DC | PRN

## 2024-04-17 MED ORDER — PREDNISONE 20 MG PO TABS: 60.0000 mg | ORAL_TABLET | Freq: Every day | ORAL | 0 refills | Status: AC

## 2024-04-17 NOTE — Telephone Encounter (Signed)
 FYI Only or Action Required?: FYI only for provider.  Patient was last seen in primary care on 07/08/2023 by Antonette Angeline ORN, NP.  Called Nurse Triage reporting Back Pain.  Symptoms began several days ago.  Interventions attempted: OTC medications: Icy Hot, Lidocaine gel, Rest, hydration, or home remedies, and Ice/heat application.  Symptoms are: gradually worsening.  Triage Disposition: See HCP Within 4 Hours (Or PCP Triage)  Patient/caregiver understands and will follow disposition?: Yes       Copied from CRM [id number]. Topic: Clinical - Red Word Triage >> Apr 17, 2024  8:05 AM Avram MATSU wrote: Red Word that prompted transfer to Nurse Triage: back is out and he's in a lot of pain and in his leg Reason for Disposition  [1] SEVERE back pain (e.g., excruciating, unable to do any normal activities) AND [2] not improved 2 hours after pain medicine  Answer Assessment - Initial Assessment Questions 1. ONSET: When did the pain begin? (e.g., minutes, hours, days)     Thursday 2. LOCATION: Where does it hurt? (upper, mid or lower back)     Low back 3. SEVERITY: How bad is the pain?  (e.g., Scale 1-10; mild, moderate, or severe)     Severe 4. PATTERN: Is the pain constant? (e.g., yes, no; constant, intermittent)      Constant, worse with ambulation, weight bearing, movement 5. RADIATION: Does the pain shoot into your legs or somewhere else?     To left leg 6. CAUSE:  What do you think is causing the back pain?      Works at [employer], possibly at work 7. BACK OVERUSE:  Any recent lifting of heavy objects, strenuous work or exercise?     Lifting with work 8. MEDICINES: What have you taken so far for the pain? (e.g., nothing, acetaminophen , NSAIDS)     Pt's wife gave him some of her Cyclobenzaprine, little to no improvement 9. NEUROLOGIC SYMPTOMS: Do you have any weakness, numbness, or problems with bowel/bladder control?     Weakness 10. OTHER SYMPTOMS: Do you have any  other symptoms? (e.g., fever, abdomen pain, burning with urination, blood in urine)       None  Protocols used: Back Pain-A-AH

## 2024-04-17 NOTE — Progress Notes (Signed)
 Acute Patient Visit  Physician: Sharrell Krawiec A Cristle Jared, MD  Patient: Tim Ramirez MRN: [id number] DOB: [birth year] PCP: Antonette Angeline ORN, NP     Subjective:   Chief Complaint  Patient presents with   Back Pain    Lower-with radiation to left leg-starting Sunday- Shooting pain-- denies numbness or tingling.. Pt has tried muscle relaxer and meloxicam  with minimal relief...  Pt reports he was lifting boxes Thurs, pulled muscle, could not stand up the next day...     HPI: The patient is a 42 y.o. male who presents today for:   Discussed the use of AI scribe software for clinical note transcription with the patient, who gave verbal consent to proceed.  History of Present Illness    Patient seen for acute visit.  Visit.  He had onset of acute back pain in the lumbar spine beginning several days ago.  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  Patient has pain radiating down to the left leg-anterior lateral portion of the leg. No weakness.  He denies any change in bowel or bladder habits.  No groin numbness or pain.  He did use meloxicam  at home.    Unable to engage in any meaningful activity at the moment pain with walking  ROS:   As noted in the HPI    ASSESMENT/PLAN:  Encounter Diagnoses  Name Primary?   Lumbar radiculopathy Yes    No orders of the defined types were placed in this encounter.   Assessment and Plan     Acute lumbar radiculopathy likely due to disc herniation.  Discussed overall prognosis and plan.  Will maximize use of muscle relaxants, anti-inflammatory medications and do a short course of prednisone  for symptomatic relief.  Patient should maximize use this type of Tylenol  as well.  We discussed reduction in activity at least for the next week.  Will follow-up with him by phone on Thursday to see if we have any symptom relief.  He has no signs that indicate need for further imaging at  the moment.  Limited use for opioids or benzodiazepines in the setting.  Discussed with patient to follow-up immediately if he has worsening pain change in bowel or bladder habits or weakness  OBJECTIVE: Vitals:   04/17/24 0936  BP: (!) 142/94  Pulse: 68  Temp: 98.5 F (36.9 C)  TempSrc: Oral  Weight: 176 lb (79.8 kg)    Body mass index is 25.25 kg/m.   Physical Exam Vitals reviewed.  Constitutional:      Appearance: Normal appearance. Well-developed with normal weight.  Cardiovascular:     Rate and Rhythm: Normal rate and regular rhythm. Normal heart sounds. Normal peripheral pulses Pulmonary:     Normal breath sounds with normal effort Skin:    General: Skin is warm and dry without noticeable rash. Neurological:     General: No focal deficit present.  Psychiatric:        Mood and Affect: Mood, behavior and cognition normal  Back:  Pain with palpation spine L5/S1, left lumbar tenderness/paraspinal.  Normal DTR LE, pain with ambulation      Allergies Patient is allergic to latex and nickel.  Past Medical History Patient  has a past medical history of Allergy and History of substance abuse (HCC) (09/16/2015).  Surgical History Patient  has a past surgical history that includes Club foot release (1980) and Vasectomy.  Family History Pateint's  family history includes Cancer in his mother; Heart disease in his father; Hyperlipidemia in his father.  Social History Patient  reports that he has never smoked. He has never been exposed to tobacco smoke. He has never used smokeless tobacco. He reports current alcohol use. He reports that he does not currently use drugs.    04/17/2024

## 2024-04-18 ENCOUNTER — Other Ambulatory Visit: Payer: Self-pay

## 2024-04-19 ENCOUNTER — Ambulatory Visit: Payer: Self-pay

## 2024-04-19 NOTE — Telephone Encounter (Signed)
 FYI Only or Action Required?: FYI only for provider.  Patient was last seen in primary care on 04/17/2024 by Zafirov, Clarissa A, MD.  Called Nurse Triage reporting Leg Pain.  Symptoms began several days ago.  Interventions attempted: OTC medications: Tylenol .  Symptoms are: gradually worsening.  Triage Disposition: See HCP Within 4 Hours (Or PCP Triage)  Patient/caregiver understands and will follow disposition?: Yes  **Appt. Scheduled for 9/25 with PCP. **        Copied from CRM #[id number]. Topic: Clinical - Red Word Triage >> Apr 19, 2024  8:54 AM Gustabo D wrote: Pt was seen yesterday but his wife says he is still in a lot of pain today. Shooting leg pain since last Thursday he got hurt at work. Reason for Disposition  [1] SEVERE pain (e.g., excruciating, unable to do any normal activities) AND [2] not improved after 2 hours of pain medicine  Answer Assessment - Initial Assessment Questions 1. ONSET: When did the pain start?      Several days ago  2. LOCATION: Where is the pain located?      Left leg, and back pain.   3. PAIN: How bad is the pain?    (Scale 1-10; or mild, moderate, severe)     Severe  4. WORK OR EXERCISE: Has there been any recent work or exercise that involved this part of the body?      Work related injury   5. CAUSE: What do you think is causing the leg pain?     Work related injury   6. OTHER SYMPTOMS: Do you have any other symptoms? (e.g., chest pain, back pain, breathing difficulty, swelling, rash, fever, numbness, weakness)  Back pain  Patient was seen in the clinic this past Monday for left leg, and back pain. Wife called in reporting pain is not getting any better, and the Tylenol  he is taking is not providing relief. She is seeking an appt. To follow up. Appt. Scheduled for 9/25 with PCP. Referred to UC/ED if pain becomes unbearable today.  Protocols used: Leg Pain-A-AH

## 2024-04-19 NOTE — Telephone Encounter (Signed)
 Will discuss at upcoming appointment tomorrow

## 2024-04-20 ENCOUNTER — Ambulatory Visit

## 2024-04-20 ENCOUNTER — Ambulatory Visit: Admitting: Internal Medicine

## 2024-04-20 ENCOUNTER — Telehealth: Payer: Self-pay

## 2024-04-20 NOTE — Telephone Encounter (Signed)
 Done

## 2024-04-20 NOTE — Telephone Encounter (Signed)
 Copied from CRM [id number]. Topic: General - Other >> Apr 20, 2024 12:24 PM Leonette SQUIBB wrote: Reason for CRM: patients wife called saying the work note that [place] wrote for the patient was not signed.  She wants to know if she will sign a note a fax it to patients work.    [employer] # is [phone]

## 2024-04-20 NOTE — Progress Notes (Deleted)
 Subjective:    Patient ID: Tim Ramirez, male    DOB: 03-22-1981, 43 y.o.   MRN: [id number]  HPI   Review of Systems   Past Medical History:  Diagnosis Date   Allergy    History of substance abuse (HCC) 09/16/2015    Current Outpatient Medications  Medication Sig Dispense Refill   meloxicam  (MOBIC ) 7.5 MG tablet Take 1 tablet (7.5 mg total) by mouth daily. 30 tablet 0   methocarbamol  (ROBAXIN ) 500 MG tablet Take 1 tablet (500 mg total) by mouth every 8 (eight) hours as needed for muscle spasms. 60 tablet 0   predniSONE  (DELTASONE ) 20 MG tablet Take 3 tablets (60 mg total) by mouth daily with breakfast for 5 days. 15 tablet 0   No current facility-administered medications for this visit.    Allergies  Allergen Reactions   Latex Rash   Nickel Rash    Family History  Problem Relation Age of Onset   Cancer Mother        skin cancer   Hyperlipidemia Father    Heart disease Father        MI x 3    Social History   Socioeconomic History   Marital status: Married    Spouse name: Not on file   Number of children: Not on file   Years of education: Not on file   Highest education level: Not on file  Occupational History   Not on file  Tobacco Use   Smoking status: Never    Passive exposure: Never   Smokeless tobacco: Never  Vaping Use   Vaping status: Never Used  Substance and Sexual Activity   Alcohol use: Yes    Comment: occasionally   Drug use: Not Currently   Sexual activity: Yes    Birth control/protection: Surgical    Comment: Vas 03/23/2018  Other Topics Concern   Not on file  Social History Narrative   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    2 kids   High school education    Married    Social Drivers of Health   Financial Resource Strain: Patient Declined (07/08/2023)   Overall Financial Resource Strain (CARDIA)    Difficulty of Paying Living Expenses: Patient declined  Food Insecurity: Patient Declined (07/08/2023)   Hunger Vital Sign    Worried  About Running Out of Food in the Last Year: Patient declined    Ran Out of Food in the Last Year: Patient declined  Transportation Needs: Patient Declined (07/08/2023)   PRAPARE - Administrator, Civil Service (Medical): Patient declined    Lack of Transportation (Non-Medical): Patient declined  Physical Activity: Patient Declined (07/08/2023)   Exercise Vital Sign    Days of Exercise per Week: Patient declined    Minutes of Exercise per Session: Patient declined  Stress: Patient Declined (07/08/2023)   [employer] of Occupational Health - Occupational Stress Questionnaire    Feeling of Stress : Patient declined  Social Connections: Patient Declined (07/08/2023)   Social Connection and Isolation Panel    Frequency of Communication with Friends and Family: Patient declined    Frequency of Social Gatherings with Friends and Family: Patient declined    Attends Religious Services: Patient declined    Active Member of Clubs or Organizations: Patient declined    Attends Banker Meetings: Patient declined    Marital Status: Patient declined  Intimate Partner Violence: Patient Declined (07/08/2023)   Humiliation, Afraid, Rape, and Kick questionnaire  Fear of Current or Ex-Partner: Patient declined    Emotionally Abused: Patient declined    Physically Abused: Patient declined    Sexually Abused: Patient declined     Constitutional: Denies fever, malaise, fatigue, headache or abrupt weight changes.  HEENT: Denies eye pain, eye redness, ear pain, ringing in the ears, wax buildup, runny nose, nasal congestion, bloody nose, or sore throat. Respiratory: Denies difficulty breathing, shortness of breath, cough or sputum production.   Cardiovascular: Denies chest pain, chest tightness, palpitations or swelling in the hands or feet.  Gastrointestinal: Pt reports intermittent reflux. Denies abdominal pain, bloating, constipation, diarrhea or blood in the stool.  GU:  Denies urgency, frequency, pain with urination, burning sensation, blood in urine, odor or discharge. Musculoskeletal: Patient reports low back pain.  Denies decrease in range of motion, difficulty with gait, or joint swelling.  Skin: Denies redness, rashes, lesions or ulcercations.  Neurological: Denies dizziness, difficulty with memory, difficulty with speech or problems with balance and coordination.  Psych: Denies anxiety, depression, SI/HI.  No other specific complaints in a complete review of systems (except as listed in HPI above).      Objective:   Physical Exam  There were no vitals taken for this visit.  Wt Readings from Last 3 Encounters:  04/17/24 176 lb (79.8 kg)  07/08/23 176 lb 12.8 oz (80.2 kg)  01/07/22 170 lb 12.8 oz (77.5 kg)    General: Appears his stated age, overweight, in NAD. Skin: Warm, dry and intact. HEENT: Head: normal shape and size; Eyes: sclera white, no icterus, conjunctiva pink, PERRLA and EOMs intact;  Neck:  Neck supple, trachea midline. No masses, lumps or thyromegaly present.  Cardiovascular: Normal rate and rhythm. S1,S2 noted.  No murmur, rubs or gallops noted. No JVD or BLE edema.  Pulmonary/Chest: Normal effort and positive vesicular breath sounds. No respiratory distress. No wheezes, rales or ronchi noted.  Abdomen: Soft and nontender. Normal bowel sounds. Musculoskeletal: Strength 5/5 BUE/BLE.SABRA No difficulty with gait.  Neurological: Alert and oriented. Cranial nerves II-XII grossly intact. Coordination normal.  Psychiatric: Mood and affect normal. Behavior is normal. Judgment and thought content normal.    BMET    Component Value Date/Time   NA 140 07/08/2023 1018   K 4.4 07/08/2023 1018   CL 104 07/08/2023 1018   CO2 29 07/08/2023 1018   GLUCOSE 108 (H) 07/08/2023 1018   BUN 13 07/08/2023 1018   CREATININE 0.90 07/08/2023 1018   CALCIUM 9.6 07/08/2023 1018    Lipid Panel     Component Value Date/Time   CHOL 164 07/08/2023  1018   TRIG 49 07/08/2023 1018   HDL 77 07/08/2023 1018   CHOLHDL 2.1 07/08/2023 1018   VLDL 11.2 01/01/2022 1447   LDLCALC 73 07/08/2023 1018    CBC    Component Value Date/Time   WBC 4.9 07/08/2023 1018   RBC 4.45 07/08/2023 1018   HGB 14.8 07/08/2023 1018   HCT 43.0 07/08/2023 1018   PLT 235 07/08/2023 1018   MCV 96.6 07/08/2023 1018   MCH 33.3 (H) 07/08/2023 1018   MCHC 34.4 07/08/2023 1018   RDW 12.2 07/08/2023 1018   LYMPHSABS 1.9 01/01/2022 1447   MONOABS 0.4 01/01/2022 1447   EOSABS 0.2 01/01/2022 1447   BASOSABS 0.0 01/01/2022 1447    Hgb A1C Lab Results  Component Value Date   HGBA1C 5.1 07/08/2023            Assessment & Plan:     RTC in  3 months for your annual exam Angeline Laura, NP

## 2024-04-24 ENCOUNTER — Telehealth (INDEPENDENT_AMBULATORY_CARE_PROVIDER_SITE_OTHER)

## 2024-04-24 DIAGNOSIS — M5126 Other intervertebral disc displacement, lumbar region: Secondary | ICD-10-CM | POA: Diagnosis not present

## 2024-04-24 MED ORDER — GABAPENTIN 300 MG PO CAPS: 300.0000 mg | ORAL_CAPSULE | Freq: Three times a day (TID) | ORAL | 3 refills | Status: DC

## 2024-04-24 NOTE — Progress Notes (Signed)
Acute Patient Visit  Physician: Dyer Klug A Meridian Scherger, MD  Patient: Tim Ramirez MRN: [id number] DOB: Dec 24, 1980 PCP: Antonette Angeline ORN, NP     Subjective:  No chief complaint on file.    HPI: The patient is a 43 y.o. male who presents today for:   Discussed the use of AI scribe software for clinical note transcription with the patient, who gave verbal consent to proceed.  History of Present Illness   Tim Ramirez is a 43 year old male who presents with persistent leg pain and numbness following a recent back injury.  Lumbar radiculopathy symptoms - Persistent leg pain and numbness following a recent back injury - Leg pain remains significant despite improvement in back pain - Pain described as now 6/10 from 10/10 - Pain exacerbated by walking, standing, and sitting - Difficulty sleeping due to discomfort - Second toe occasionally experiences paresthesia ('falls asleep')  Functional impairment - Unable to work due to severity of symptoms - Requested a work note to cover absence  Prior evaluation and treatment - Seen approximately one week ago for initial evaluation - Prescribed prednisone  with some relief - Lumbar x-ray previously performed - MRI scheduled to further evaluate extent of nerve compression   - Neurosurgery appt scheduled for Oct 4th    ROS:   As noted in the HPI    ASSESMENT/PLAN:  Encounter Diagnoses  Name Primary?   Lumbar disc herniation Yes    No orders of the defined types were placed in this encounter.   Assessment and Plan    Lumbar radiculopathy with nerve compression and low back pain Persistent lumbar radiculopathy with nerve compression. Significant leg pain persists, indicating ongoing nerve compression. Numbness in the second toe suggests nerve involvement. Condition slowly improving, but pain remains significant. - Prescribe gabapentin  to trial for nightitme pain - Refer to neurosurgeon for further evaluation  -  Instruct to report any adverse reactions to gabapentin . - Provide work note excusing from work until May 01, 2024, with the possibility of extension based on neurosurgeon's evaluation. - Advise to obtain additional work note from neurosurgeon if needed.            OBJECTIVE: There were no vitals filed for this visit.  There is no height or weight on file to calculate BMI.   Physical Exam Vitals reviewed.  Constitutional:      Appearance: Normal appearance. Well-developed with normal weight.  Cardiovascular:     Rate and Rhythm: Normal rate and regular rhythm. Normal heart sounds. Normal peripheral pulses Pulmonary:     Normal breath sounds with normal effort Skin:    General: Skin is warm and dry without noticeable rash. Neurological:     General: No focal deficit present.  Psychiatric:        Mood and Affect: Mood, behavior and cognition normal       Allergies Patient is allergic to latex and nickel.  Past Medical History Patient  has a past medical history of Allergy and History of substance abuse (HCC) (09/16/2015).  Surgical History Patient  has a past surgical history that includes Club foot release (1980) and Vasectomy.  Family History Pateint's family history includes Cancer in his mother; Heart disease in his father; Hyperlipidemia in his father.  Social History Patient  reports that he has never smoked. He has never been exposed to tobacco smoke. He has never used smokeless tobacco. He reports current alcohol use. He reports that he does not currently use drugs.  04/24/2024 

## 2024-04-25 ENCOUNTER — Telehealth: Payer: Self-pay

## 2024-04-25 ENCOUNTER — Encounter: Payer: Self-pay | Admitting: Internal Medicine

## 2024-04-25 NOTE — Telephone Encounter (Signed)
 Patient is in office. Working on work note now to provide to patient.

## 2024-04-25 NOTE — Telephone Encounter (Signed)
 Copied from CRM #[id number]. Topic: General - Other >> Apr 25, 2024  9:11 AM Dawna HERO wrote: Reason for CRM: pt states they never received the work note they messaged about on mychart, says the one from last week wasn't signed, wants to know if they can pick one up today

## 2024-06-06 ENCOUNTER — Ambulatory Visit: Payer: Self-pay

## 2024-06-06 NOTE — Telephone Encounter (Signed)
 FYI Only or Action Required?: FYI only for provider: appointment scheduled on 11/14.  Patient was last seen in primary care on 04/24/2024 by Zafirov, Clarissa A, MD.  Called Nurse Triage reporting Numbness.  Symptoms began about a month ago.  Interventions attempted: Nothing.  Symptoms are: unchanged.  Triage Disposition: See PCP When Office is Open (Within 3 Days)  Patient/caregiver understands and will follow disposition?: Yes          Copied from CRM #[id number]. Topic: Clinical - Red Word Triage >> Jun 06, 2024  1:35 PM Pinkey ORN wrote: Red Word that prompted transfer to Nurse Triage: Losing Feeling In Toe (Left Side) Reason for Disposition  [1] Weakness of arm / hand, or leg / foot AND [2] is a chronic symptom (recurrent or ongoing AND present > 4 weeks)  Answer Assessment - Initial Assessment Questions 1. SYMPTOM: What is the main symptom you are concerned about? (e.g., weakness, numbness)     Left big toe   2. ONSET: When did this start? (e.g., minutes, hours, days; while sleeping)     X 1 month   3. LAST NORMAL: When was the last time you (the patient) were normal (no symptoms)?     X 1 month ago, patient is being seen for sciatic nerve pain   4. PATTERN Does this come and go, or has it been constant since it started?  Is it present now?     Intermittent   5. CARDIAC SYMPTOMS: Have you had any of the following symptoms: chest pain, difficulty breathing, palpitations?     No   6. NEUROLOGIC SYMPTOMS: Have you had any of the following symptoms: headache, dizziness, vision loss, double vision, changes in speech, unsteady on your feet?     No   7. OTHER SYMPTOMS: Do you have any other symptoms? No other symptoms noted.    Appointment scheduled for evaluation on 11/14 per wife's request. Patient agrees with plan of care, and will call back if anything changes, or if symptoms worsen.  Protocols used: Neurologic Deficit-A-AH

## 2024-06-06 NOTE — Telephone Encounter (Signed)
 Will discuss at upcoming appointment

## 2024-06-09 ENCOUNTER — Ambulatory Visit (INDEPENDENT_AMBULATORY_CARE_PROVIDER_SITE_OTHER): Admitting: Internal Medicine

## 2024-06-09 ENCOUNTER — Encounter: Payer: Self-pay | Admitting: Internal Medicine

## 2024-06-09 VITALS — BP 124/74 | Ht 70.0 in | Wt 189.0 lb

## 2024-06-09 DIAGNOSIS — G8929 Other chronic pain: Secondary | ICD-10-CM

## 2024-06-09 DIAGNOSIS — M5442 Lumbago with sciatica, left side: Secondary | ICD-10-CM

## 2024-06-09 NOTE — Progress Notes (Signed)
 Subjective:    Patient ID: Tim Ramirez, male    DOB: 1981-01-20, 43 y.o.   MRN: [id number]  HPI  Discussed the use of AI scribe software for clinical note transcription with the patient, who gave verbal consent to proceed.    Tim Ramirez is a 43 year old male who presents with numbness and tingling in his left foot.  Approximately seven weeks ago, he experienced a slipped disc while lifting heavy objects at work, resulting in sharp, stabbing pain in his back and left leg. The pain was severe enough to limit his ability to walk, and his back felt 'locked up'. The pain originated between the L4 and L5 vertebrae and radiated down his left leg.  Initially, he was prescribed prednisone , meloxicam , and methocarbamol , but his pain did not improve, leading to a follow-up visit a week later. He was also prescribed gabapentin , which he felt did not alleviate his symptoms.  Subsequently, he sought care at [facility], where he underwent an x-ray and MRI. He was prescribed tramadol  and a muscle relaxer and received an epidural steroid injection two weeks later, which significantly reduced his pain from a level of 7-8 to 2-3.  Two weeks ago, he began experiencing numbness and tingling in his left big toe and part of his foot, which he attributes to the ongoing back issue. These symptoms occur more frequently in the afternoon after being on his feet for several hours.  He has not yet engaged in physical therapy, although he has been approved for it. 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 He does not use a back brace and has not smoked.  Currently, he is not taking gabapentin , as he felt it was ineffective during his initial severe pain phase.      Review of Systems   Past Medical History:  Diagnosis Date   Allergy    History of substance abuse (HCC) 09/16/2015    Current Outpatient Medications   Medication Sig Dispense Refill   gabapentin  (NEURONTIN ) 300 MG capsule Take 1 capsule (300 mg total) by mouth 3 (three) times daily. 90 capsule 3   meloxicam  (MOBIC ) 7.5 MG tablet Take 1 tablet (7.5 mg total) by mouth daily. 30 tablet 0   methocarbamol  (ROBAXIN ) 500 MG tablet Take 1 tablet (500 mg total) by mouth every 8 (eight) hours as needed for muscle spasms. 60 tablet 0   No current facility-administered medications for this visit.    Allergies  Allergen Reactions   Latex Rash   Nickel Rash    Family History  Problem Relation Age of Onset   Cancer Mother        skin cancer   Hyperlipidemia Father    Heart disease Father        MI x 3    Social History   Socioeconomic History   Marital status: Married    Spouse name: Not on file   Number of children: Not on file   Years of education: Not on file   Highest education level: Not on file  Occupational History   Not on file  Tobacco Use   Smoking status: Never    Passive exposure: Never   Smokeless tobacco: Never  Vaping Use   Vaping status: Never Used  Substance and Sexual Activity   Alcohol use: Yes    Comment: occasionally   Drug use: Not Currently   Sexual activity: Yes  Birth control/protection: Surgical    Comment: Vas 03/23/2018  Other Topics Concern   Not on file  Social History Narrative   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    2 kids   High school education    Married    Social Drivers of Health   Financial Resource Strain: Patient Declined (07/08/2023)   Overall Financial Resource Strain (CARDIA)    Difficulty of Paying Living Expenses: Patient declined  Food Insecurity: Patient Declined (07/08/2023)   Hunger Vital Sign    Worried About Running Out of Food in the Last Year: Patient declined    Ran Out of Food in the Last Year: Patient declined  Transportation Needs: Patient Declined (07/08/2023)   PRAPARE - Administrator, Civil Service (Medical): Patient declined    Lack of Transportation  (Non-Medical): Patient declined  Physical Activity: Patient Declined (07/08/2023)   Exercise Vital Sign    Days of Exercise per Week: Patient declined    Minutes of Exercise per Session: Patient declined  Stress: Patient Declined (07/08/2023)   [employer] of Occupational Health - Occupational Stress Questionnaire    Feeling of Stress : Patient declined  Social Connections: Patient Declined (07/08/2023)   Social Connection and Isolation Panel    Frequency of Communication with Friends and Family: Patient declined    Frequency of Social Gatherings with Friends and Family: Patient declined    Attends Religious Services: Patient declined    Database Administrator or Organizations: Patient declined    Attends Banker Meetings: Patient declined    Marital Status: Patient declined  Intimate Partner Violence: Patient Declined (07/08/2023)   Humiliation, Afraid, Rape, and Kick questionnaire    Fear of Current or Ex-Partner: Patient declined    Emotionally Abused: Patient declined    Physically Abused: Patient declined    Sexually Abused: Patient declined     Constitutional: Denies fever, malaise, fatigue, headache or abrupt weight changes.  Respiratory: Denies difficulty breathing, shortness of breath, cough or sputum production.   Cardiovascular: Denies chest pain, chest tightness, palpitations or swelling in the hands or feet.  Gastrointestinal: Pt reports intermittent reflux. Denies abdominal pain, bloating, constipation, diarrhea or blood in the stool.  GU: Denies urgency, frequency, pain with urination, burning sensation, blood in urine, odor or discharge. Musculoskeletal: Patient reports back pain.  Denies decrease in range of motion, difficulty with gait, muscle pain or joint swelling.  Skin: Denies redness, rashes, lesions or ulcercations.  Neurological: Patient reports paresthesia left foot.  Denies dizziness, difficulty with memory, difficulty with speech or  problems with balance and coordination.   No other specific complaints in a complete review of systems (except as listed in HPI above).      Objective:   Physical Exam  BP 124/74 (BP Location: Left Arm, Patient Position: Sitting, Cuff Size: Normal)   Ht 5' 10 (1.778 m)   Wt 189 lb (85.7 kg)   BMI 27.12 kg/m    Wt Readings from Last 3 Encounters:  04/17/24 176 lb (79.8 kg)  07/08/23 176 lb 12.8 oz (80.2 kg)  01/07/22 170 lb 12.8 oz (77.5 kg)    General: Appears his stated age, overweight, in NAD. Skin: Warm, dry and intact. Cardiovascular: Normal rate and rhythm. S1,S2 noted.  No murmur, rubs or gallops noted.  Pedal pulse 2+ on left. Pulmonary/Chest: Normal effort and positive vesicular breath sounds. No respiratory distress. No wheezes, rales or ronchi noted.  Musculoskeletal: Decreased flexion of the lumbar spine.  Normal  extension, rotation and lateral bending.  No pain with palpation of the lumbar spine.  Strength 5/5 BLE.  Able to stand on tiptoes and heels.  No difficulty with gait.  Neurological: Alert and oriented.  Sensation intact to LLE.  Negative SLR on the left.   BMET    Component Value Date/Time   NA 140 07/08/2023 1018   K 4.4 07/08/2023 1018   CL 104 07/08/2023 1018   CO2 29 07/08/2023 1018   GLUCOSE 108 (H) 07/08/2023 1018   BUN 13 07/08/2023 1018   CREATININE 0.90 07/08/2023 1018   CALCIUM 9.6 07/08/2023 1018    Lipid Panel     Component Value Date/Time   CHOL 164 07/08/2023 1018   TRIG 49 07/08/2023 1018   HDL 77 07/08/2023 1018   CHOLHDL 2.1 07/08/2023 1018   VLDL 11.2 01/01/2022 1447   LDLCALC 73 07/08/2023 1018    CBC    Component Value Date/Time   WBC 4.9 07/08/2023 1018   RBC 4.45 07/08/2023 1018   HGB 14.8 07/08/2023 1018   HCT 43.0 07/08/2023 1018   PLT 235 07/08/2023 1018   MCV 96.6 07/08/2023 1018   MCH 33.3 (H) 07/08/2023 1018   MCHC 34.4 07/08/2023 1018   RDW 12.2 07/08/2023 1018   LYMPHSABS 1.9 01/01/2022 1447    MONOABS 0.4 01/01/2022 1447   EOSABS 0.2 01/01/2022 1447   BASOSABS 0.0 01/01/2022 1447    Hgb A1C Lab Results  Component Value Date   HGBA1C 5.1 07/08/2023            Assessment & Plan:   Assessment and Plan    Lumbosacral radiculopathy, left side Chronic left-sided lumbosacral radiculopathy with improved pain post-injection. New numbness and tingling in left big toe and foot, likely due to nerve impingement from L4-L5 disc issue. Gabapentin  at low dose ineffective. - Gabapentin  300 mg in the afternoon for nerve discomfort. -Would recommend starting physical therapy if symptoms persist or worsen - Wear back brace during heavy lifting. - Follow up with [facility] for further management and possible repeat injections. - Avoid heavy lifting to prevent symptom exacerbation.      RTC in 1 month for your annual exam Angeline Laura, NP

## 2024-06-09 NOTE — Patient Instructions (Signed)
 Sciatica  Sciatica is pain, weakness, tingling, or loss of feeling (numbness) along the sciatic nerve. The sciatic nerve starts in the lower back and goes down the back of each leg. Sciatica usually affects one side of the body. Sciatica usually goes away on its own or with treatment. Sometimes, sciatica may come back. What are the causes? This condition happens when the sciatic nerve is pinched or has pressure put on it. This may be caused by: A disk in between the bones of the spine bulging out too far (herniated disk). Changes in the spinal disks due to aging. A condition that affects a muscle in the butt. Extra bone growth near the sciatic nerve. A break (fracture) of the area between your hip bones (pelvis). Pregnancy. Tumor. This is rare. What increases the risk? You are more likely to develop this condition if you: Play sports that put pressure or stress on the spine. Have poor strength and ease of movement (flexibility). Have had a back injury or back surgery. Sit for long periods of time. Do activities that involve bending or lifting over and over again. Are very overweight (obese). What are the signs or symptoms? Symptoms can vary from mild to very bad. They may include: Any of these problems in the lower back, leg, hip, or butt: Mild tingling, loss of feeling, or dull aches. A burning feeling. Sharp pains. Loss of feeling in the back of the calf or the sole of the foot. Leg weakness. Very bad back pain that makes it hard to move. These symptoms may get worse when you cough, sneeze, or laugh. They may also get worse when you sit or stand for long periods of time. How is this treated? This condition often gets better without any treatment. However, treatment may include: Changing or cutting back on physical activity when you have pain. Exercising, including strengthening and stretching. Putting ice or heat on the affected area. Shots of medicines to relieve pain and  swelling or to relax your muscles. Surgery. Follow these instructions at home: Medicines Take over-the-counter and prescription medicines only as told by your doctor. Ask your doctor if you should avoid driving or using machines while you are taking your medicine. Managing pain     If told, put ice on the affected area. To do this: Put ice in a plastic bag. Place a towel between your skin and the bag. Leave the ice on for 20 minutes, 2-3 times a day. If your skin turns bright red, take off the ice right away to prevent skin damage. The risk of skin damage is higher if you cannot feel pain, heat, or cold. If told, put heat on the affected area. Do this as often as told by your doctor. Use the heat source that your doctor tells you to use, such as a moist heat pack or a heating pad. Place a towel between your skin and the heat source. Leave the heat on for 20-30 minutes. If your skin turns bright red, take off the heat right away to prevent burns. The risk of burns is higher if you cannot feel pain, heat, or cold. Activity  Return to your normal activities when your doctor says that it is safe. Avoid activities that make your symptoms worse. Take short rests during the day. When you rest for a long time, do some physical activity or stretching between periods of rest. Avoid sitting for a long time without moving. Get up and move around at least one time each  hour. Do exercises and stretches as told by your doctor. Do not lift anything that is heavier than 10 lb (4.5 kg). Avoid lifting heavy things even when you do not have symptoms. Avoid lifting heavy things over and over. When you lift objects, always lift in a way that is safe for your body. To do this, you should: Bend your knees. Keep the object close to your body. Avoid twisting. General instructions Stay at a healthy weight. Wear comfortable shoes that support your feet. Avoid wearing high heels. Avoid sleeping on a mattress  that is too soft or too hard. You might have less pain if you sleep on a mattress that is firm enough to support your back. Contact a doctor if: Your pain is not controlled by medicine. Your pain does not get better. Your pain gets worse. Your pain lasts longer than 4 weeks. You lose weight without trying. Get help right away if: You cannot control when you pee (urinate) or poop (have a bowel movement). You have weakness in any of these areas and it gets worse: Lower back. The area between your hip bones. Butt. Legs. You have redness or swelling of your back. You have a burning feeling when you pee. Summary Sciatica is pain, weakness, tingling, or loss of feeling (numbness) along the sciatic nerve. This may include the lower back, legs, hips, and butt. This condition happens when the sciatic nerve is pinched or has pressure put on it. Treatment often includes rest, exercise, medicines, and putting ice or heat on the affected area. This information is not intended to replace advice given to you by your health care provider. Make sure you discuss any questions you have with your health care provider. Document Revised: 10/20/2021 Document Reviewed: 10/20/2021 Elsevier Patient Education  2024 [employer].

## 2024-07-14 ENCOUNTER — Ambulatory Visit: Payer: Self-pay | Admitting: Internal Medicine

## 2024-08-14 ENCOUNTER — Ambulatory Visit (INDEPENDENT_AMBULATORY_CARE_PROVIDER_SITE_OTHER): Admitting: Internal Medicine

## 2024-08-14 ENCOUNTER — Encounter: Payer: Self-pay | Admitting: Internal Medicine

## 2024-08-14 VITALS — BP 124/74 | Ht 70.0 in | Wt 191.2 lb

## 2024-08-14 DIAGNOSIS — R739 Hyperglycemia, unspecified: Secondary | ICD-10-CM | POA: Diagnosis not present

## 2024-08-14 DIAGNOSIS — Z0001 Encounter for general adult medical examination with abnormal findings: Secondary | ICD-10-CM | POA: Diagnosis not present

## 2024-08-14 DIAGNOSIS — Z1211 Encounter for screening for malignant neoplasm of colon: Secondary | ICD-10-CM | POA: Diagnosis not present

## 2024-08-14 DIAGNOSIS — Z6827 Body mass index (BMI) 27.0-27.9, adult: Secondary | ICD-10-CM

## 2024-08-14 DIAGNOSIS — Z136 Encounter for screening for cardiovascular disorders: Secondary | ICD-10-CM | POA: Diagnosis not present

## 2024-08-14 DIAGNOSIS — E663 Overweight: Secondary | ICD-10-CM | POA: Diagnosis not present

## 2024-08-14 NOTE — Assessment & Plan Note (Signed)
 Encouraged diet and exercise for weight loss ?

## 2024-08-14 NOTE — Progress Notes (Signed)
 "  Subjective:    Patient ID: Tim Ramirez, male    DOB: 1981/01/02, 44 y.o.   MRN: [id number]  HPI  Pt presents to the clinic today for his annual exam.  Flu: never Tetanus: 08/2015 Covid: never Colon screening: Never Vision screening: every 1-2 years Dentist: biannually  Diet: He does eat meat. He consumes some fruits and veggies. He does eat fried foods. He drinks mostly water, gatorade, soda and sweet tea. Exercise: Walking  Review of Systems   Past Medical History:  Diagnosis Date   Allergy    Anxiety    History of substance abuse (HCC) 09/16/2015    Current Outpatient Medications  Medication Sig Dispense Refill   gabapentin  (NEURONTIN ) 300 MG capsule Take 1 capsule (300 mg total) by mouth 3 (three) times daily. (Patient not taking: Reported on 06/09/2024) 90 capsule 3   meloxicam  (MOBIC ) 7.5 MG tablet Take 1 tablet (7.5 mg total) by mouth daily. 30 tablet 0   Tadalafil 2.5 MG TABS Take 2.5 mg by mouth.     No current facility-administered medications for this visit.    Allergies  Allergen Reactions   Latex Rash   Nickel Rash    Family History  Problem Relation Age of Onset   Cancer Mother        skin cancer   Hyperlipidemia Father    Heart disease Father        MI x 3    Social History   Socioeconomic History   Marital status: Married    Spouse name: Not on file   Number of children: Not on file   Years of education: Not on file   Highest education level: 12th grade  Occupational History   Not on file  Tobacco Use   Smoking status: Never    Passive exposure: Never   Smokeless tobacco: Never  Vaping Use   Vaping status: Never Used  Substance and Sexual Activity   Alcohol use: Yes    Comment: occasionally   Drug use: Not Currently   Sexual activity: Yes    Birth control/protection: Surgical    Comment: Vas 03/23/2018  Other Topics Concern   Not on file  Social History Narrative   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    2 kids   High school  education    Married    Social Drivers of Health   Tobacco Use: Low Risk (06/09/2024)   Patient History    Smoking Tobacco Use: Never    Smokeless Tobacco Use: Never    Passive Exposure: Never  Financial Resource Strain: Medium Risk (08/13/2024)   Overall Financial Resource Strain (CARDIA)    Difficulty of Paying Living Expenses: Somewhat hard  Food Insecurity: No Food Insecurity (08/13/2024)   Epic    Worried About Programme Researcher, Broadcasting/film/video in the Last Year: Never true    Ran Out of Food in the Last Year: Never true  Transportation Needs: No Transportation Needs (08/13/2024)   Epic    Lack of Transportation (Medical): No    Lack of Transportation (Non-Medical): No  Physical Activity: Inactive (08/13/2024)   Exercise Vital Sign    Days of Exercise per Week: 0 days    Minutes of Exercise per Session: Not on file  Stress: No Stress Concern Present (08/13/2024)   [employer] of Occupational Health - Occupational Stress Questionnaire    Feeling of Stress: Only a little  Social Connections: Moderately Isolated (08/13/2024)   Social Connection and Isolation Panel  Frequency of Communication with Friends and Family: Once a week    Frequency of Social Gatherings with Friends and Family: Never    Attends Religious Services: More than 4 times per year    Active Member of [employer] or Organizations: No    Attends Engineer, Structural: Not on file    Marital Status: Married  Intimate Partner Violence: Patient Declined (07/08/2023)   Humiliation, Afraid, Rape, and Kick questionnaire    Fear of Current or Ex-Partner: Patient declined    Emotionally Abused: Patient declined    Physically Abused: Patient declined    Sexually Abused: Patient declined  Depression (PHQ2-9): Low Risk (06/09/2024)   Depression (PHQ2-9)    PHQ-2 Score: 4  Alcohol Screen: Low Risk (08/13/2024)   Alcohol Screen    Last Alcohol Screening Score (AUDIT): 5  Housing: Unknown (08/13/2024)   Epic    Unable to Pay  for Housing in the Last Year: No    Number of Times Moved in the Last Year: Not on file    Homeless in the Last Year: No  Utilities: Patient Declined (07/08/2023)   [facility] Utilities    Threatened with loss of utilities: Patient declined  Health Literacy: Patient Declined (07/08/2023)   B1300 Health Literacy    Frequency of need for help with medical instructions: Patient declines to respond     Constitutional: Denies fever, malaise, fatigue, headache or abrupt weight changes.  HEENT: Denies eye pain, eye redness, ear pain, ringing in the ears, wax buildup, runny nose, nasal congestion, bloody nose, or sore throat. Respiratory: Denies difficulty breathing, shortness of breath, cough or sputum production.   Cardiovascular: Denies chest pain, chest tightness, palpitations or swelling in the hands or feet.  Gastrointestinal: Pt reports intermittent reflux. Denies abdominal pain, bloating, constipation, diarrhea or blood in the stool.  GU: Denies urgency, frequency, pain with urination, burning sensation, blood in urine, odor or discharge. Musculoskeletal: Patient reports intermittent back pain.  Denies decrease in range of motion, difficulty with gait, muscle pain or joint swelling.  Skin: Denies redness, rashes, lesions or ulcercations.  Neurological: Denies dizziness, difficulty with memory, difficulty with speech or problems with balance and coordination.  Psych: Denies anxiety, depression, SI/HI.  No other specific complaints in a complete review of systems (except as listed in HPI above).      Objective:   Physical Exam BP 124/74 (BP Location: Left Arm, Patient Position: Sitting, Cuff Size: Large)   Ht 5' 10 (1.778 m)   Wt 191 lb 3.2 oz (86.7 kg)   BMI 27.43 kg/m    Wt Readings from Last 3 Encounters:  06/09/24 189 lb (85.7 kg)  04/17/24 176 lb (79.8 kg)  07/08/23 176 lb 12.8 oz (80.2 kg)    General: Appears his stated age, overweight, in NAD. Skin: Warm, dry and  intact. HEENT: Head: normal shape and size; Eyes: sclera white, no icterus, conjunctiva pink, PERRLA and EOMs intact;  Neck:  Neck supple, trachea midline. No masses, lumps or thyromegaly present.  Cardiovascular: Normal rate and rhythm. S1,S2 noted.  No murmur, rubs or gallops noted. No JVD or BLE edema.  Pulmonary/Chest: Normal effort and positive vesicular breath sounds. No respiratory distress. No wheezes, rales or ronchi noted.  Abdomen: Soft and nontender. Normal bowel sounds. Musculoskeletal: Strength 5/5 BUE/BLE.SABRA No difficulty with gait.  Neurological: Alert and oriented. Cranial nerves II-XII grossly intact. Coordination normal.  Psychiatric: Mood and affect normal. Behavior is normal. Judgment and thought content normal.    BMET  Component Value Date/Time   NA 140 07/08/2023 1018   K 4.4 07/08/2023 1018   CL 104 07/08/2023 1018   CO2 29 07/08/2023 1018   GLUCOSE 108 (H) 07/08/2023 1018   BUN 13 07/08/2023 1018   CREATININE 0.90 07/08/2023 1018   CALCIUM 9.6 07/08/2023 1018    Lipid Panel     Component Value Date/Time   CHOL 164 07/08/2023 1018   TRIG 49 07/08/2023 1018   HDL 77 07/08/2023 1018   CHOLHDL 2.1 07/08/2023 1018   VLDL 11.2 01/01/2022 1447   LDLCALC 73 07/08/2023 1018    CBC    Component Value Date/Time   WBC 4.9 07/08/2023 1018   RBC 4.45 07/08/2023 1018   HGB 14.8 07/08/2023 1018   HCT 43.0 07/08/2023 1018   PLT 235 07/08/2023 1018   MCV 96.6 07/08/2023 1018   MCH 33.3 (H) 07/08/2023 1018   MCHC 34.4 07/08/2023 1018   RDW 12.2 07/08/2023 1018   LYMPHSABS 1.9 01/01/2022 1447   MONOABS 0.4 01/01/2022 1447   EOSABS 0.2 01/01/2022 1447   BASOSABS 0.0 01/01/2022 1447    Hgb A1C Lab Results  Component Value Date   HGBA1C 5.1 07/08/2023            Assessment & Plan:  Preventative health maintenance:  Flu shot declined Tetanus UTD Encouraged him to get his COVID-vaccine Cologuard ordered Encouraged him to consume a balanced  diet and exercise regimen Advised him to see an eye doctor and dentist annually Will check CBC, c-Met, lipid, A1c today   RTC in 1 year for your annual exam Angeline Laura, NP  "

## 2024-08-14 NOTE — Patient Instructions (Signed)
 Health Maintenance, Male  Adopting a healthy lifestyle and getting preventive care are important in promoting health and wellness. Ask your health care provider about:  The right schedule for you to have regular tests and exams.  Things you can do on your own to prevent diseases and keep yourself healthy.  What should I know about diet, weight, and exercise?  Eat a healthy diet    Eat a diet that includes plenty of vegetables, fruits, low-fat dairy products, and lean protein.  Do not eat a lot of foods that are high in solid fats, added sugars, or sodium.  Maintain a healthy weight  Body mass index (BMI) is a measurement that can be used to identify possible weight problems. It estimates body fat based on height and weight. Your health care provider can help determine your BMI and help you achieve or maintain a healthy weight.  Get regular exercise  Get regular exercise. This is one of the most important things you can do for your health. Most adults should:  Exercise for at least 150 minutes each week. The exercise should increase your heart rate and make you sweat (moderate-intensity exercise).  Do strengthening exercises at least twice a week. This is in addition to the moderate-intensity exercise.  Spend less time sitting. Even light physical activity can be beneficial.  Watch cholesterol and blood lipids  Have your blood tested for lipids and cholesterol at 46 years of age, then have this test every 5 years.  You may need to have your cholesterol levels checked more often if:  Your lipid or cholesterol levels are high.  You are older than 46 years of age.  You are at high risk for heart disease.  What should I know about cancer screening?  Many types of cancers can be detected early and may often be prevented. Depending on your health history and family history, you may need to have cancer screening at various ages. This may include screening for:  Colorectal cancer.  Prostate cancer.  Skin cancer.  Lung  cancer.  What should I know about heart disease, diabetes, and high blood pressure?  Blood pressure and heart disease  High blood pressure causes heart disease and increases the risk of stroke. This is more likely to develop in people who have high blood pressure readings or are overweight.  Talk with your health care provider about your target blood pressure readings.  Have your blood pressure checked:  Every 3-5 years if you are 24-52 years of age.  Every year if you are 3 years old or older.  If you are between the ages of 60 and 72 and are a current or former smoker, ask your health care provider if you should have a one-time screening for abdominal aortic aneurysm (AAA).  Diabetes  Have regular diabetes screenings. This checks your fasting blood sugar level. Have the screening done:  Once every three years after age 66 if you are at a normal weight and have a low risk for diabetes.  More often and at a younger age if you are overweight or have a high risk for diabetes.  What should I know about preventing infection?  Hepatitis B  If you have a higher risk for hepatitis B, you should be screened for this virus. Talk with your health care provider to find out if you are at risk for hepatitis B infection.  Hepatitis C  Blood testing is recommended for:  Everyone born from [birth year] through 1967.  Anyone  with known risk factors for hepatitis C.  Sexually transmitted infections (STIs)  You should be screened each year for STIs, including gonorrhea and chlamydia, if:  You are sexually active and are younger than 46 years of age.  You are older than 46 years of age and your health care provider tells you that you are at risk for this type of infection.  Your sexual activity has changed since you were last screened, and you are at increased risk for chlamydia or gonorrhea. Ask your health care provider if you are at risk.  Ask your health care provider about whether you are at high risk for HIV. Your health care provider  may recommend a prescription medicine to help prevent HIV infection. If you choose to take medicine to prevent HIV, you should first get tested for HIV. You should then be tested every 3 months for as long as you are taking the medicine.  Follow these instructions at home:  Alcohol use  Do not drink alcohol if your health care provider tells you not to drink.  If you drink alcohol:  Limit how much you have to 0-2 drinks a day.  Know how much alcohol is in your drink. In the [place] one drink equals one 12 oz bottle of beer (355 mL), one 5 oz glass of wine (148 mL), or one 1 oz glass of hard liquor (44 mL).  Lifestyle  Do not use any products that contain nicotine or tobacco. These products include cigarettes, chewing tobacco, and vaping devices, such as e-cigarettes. If you need help quitting, ask your health care provider.  Do not use street drugs.  Do not share needles.  Ask your health care provider for help if you need support or information about quitting drugs.  General instructions  Schedule regular health, dental, and eye exams.  Stay current with your vaccines.  Tell your health care provider if:  You often feel depressed.  You have ever been abused or do not feel safe at home.  Summary  Adopting a healthy lifestyle and getting preventive care are important in promoting health and wellness.  Follow your health care provider's instructions about healthy diet, exercising, and getting tested or screened for diseases.  Follow your health care provider's instructions on monitoring your cholesterol and blood pressure.  This information is not intended to replace advice given to you by your health care provider. Make sure you discuss any questions you have with your health care provider.  Document Revised: 12/02/2020 Document Reviewed: 12/02/2020  [employer]vier Patient Education  2024 [employer].

## 2024-08-15 ENCOUNTER — Ambulatory Visit: Payer: Self-pay | Admitting: Internal Medicine

## 2024-08-15 LAB — LIPID PANEL
Cholesterol: 150 mg/dL
HDL: 56 mg/dL
LDL Cholesterol (Calc): 77 mg/dL
Non-HDL Cholesterol (Calc): 94 mg/dL
Total CHOL/HDL Ratio: 2.7 (calc)
Triglycerides: 89 mg/dL

## 2024-08-15 LAB — COMPREHENSIVE METABOLIC PANEL WITH GFR
AG Ratio: 2.2 (calc) (ref 1.0–2.5)
ALT: 23 U/L (ref 9–46)
AST: 17 U/L (ref 10–40)
Albumin: 4.6 g/dL (ref 3.6–5.1)
Alkaline phosphatase (APISO): 50 U/L (ref 36–130)
BUN: 11 mg/dL (ref 7–25)
CO2: 26 mmol/L (ref 20–32)
Calcium: 9.2 mg/dL (ref 8.6–10.3)
Chloride: 105 mmol/L (ref 98–110)
Creat: 0.77 mg/dL (ref 0.60–1.29)
Globulin: 2.1 g/dL (ref 1.9–3.7)
Glucose, Bld: 93 mg/dL (ref 65–99)
Potassium: 4.3 mmol/L (ref 3.5–5.3)
Sodium: 138 mmol/L (ref 135–146)
Total Bilirubin: 0.6 mg/dL (ref 0.2–1.2)
Total Protein: 6.7 g/dL (ref 6.1–8.1)
eGFR: 113 mL/min/1.73m2

## 2024-08-15 LAB — CBC
HCT: 43.1 % (ref 39.4–51.1)
Hemoglobin: 14.8 g/dL (ref 13.2–17.1)
MCH: 33 pg (ref 27.0–33.0)
MCHC: 34.3 g/dL (ref 31.6–35.4)
MCV: 96 fL (ref 81.4–101.7)
MPV: 11.3 fL (ref 7.5–12.5)
Platelets: 216 Thousand/uL (ref 140–400)
RBC: 4.49 Million/uL (ref 4.20–5.80)
RDW: 11.8 % (ref 11.0–15.0)
WBC: 5 Thousand/uL (ref 3.8–10.8)

## 2024-08-15 LAB — HEMOGLOBIN A1C
Hgb A1c MFr Bld: 4.9 %
Mean Plasma Glucose: 94 mg/dL
eAG (mmol/L): 5.2 mmol/L

## 2025-08-20 ENCOUNTER — Encounter: Admitting: Internal Medicine
